# Patient Record
Sex: Female | Born: 1955 | Race: Black or African American | Hispanic: No | Marital: Married | Smoking: Former smoker
Health system: Southern US, Community
[De-identification: ages and names within clinical notes are randomized; demographics above are authoritative.]

## PROBLEM LIST (undated history)

## (undated) DIAGNOSIS — I10 Essential (primary) hypertension: Secondary | ICD-10-CM

## (undated) DIAGNOSIS — K859 Acute pancreatitis without necrosis or infection, unspecified: Secondary | ICD-10-CM

## (undated) HISTORY — PX: TUBAL LIGATION: SHX77

---

## 2002-09-07 ENCOUNTER — Other Ambulatory Visit: Admission: RE | Admit: 2002-09-07 | Discharge: 2002-09-07 | Payer: Self-pay | Admitting: Family Medicine

## 2002-09-13 ENCOUNTER — Encounter: Admission: RE | Admit: 2002-09-13 | Discharge: 2002-09-13 | Payer: Self-pay | Admitting: Family Medicine

## 2002-09-13 ENCOUNTER — Encounter: Payer: Self-pay | Admitting: Family Medicine

## 2008-08-25 ENCOUNTER — Encounter: Admission: RE | Admit: 2008-08-25 | Discharge: 2008-08-25 | Payer: Self-pay | Admitting: Internal Medicine

## 2010-03-10 ENCOUNTER — Encounter: Payer: Self-pay | Admitting: Internal Medicine

## 2011-08-26 ENCOUNTER — Other Ambulatory Visit: Payer: Self-pay | Admitting: Internal Medicine

## 2011-08-26 DIAGNOSIS — Z1231 Encounter for screening mammogram for malignant neoplasm of breast: Secondary | ICD-10-CM

## 2011-08-29 ENCOUNTER — Ambulatory Visit
Admission: RE | Admit: 2011-08-29 | Discharge: 2011-08-29 | Disposition: A | Discharge status: Home / Self Care | Payer: 59 | Source: Ambulatory Visit | Attending: Internal Medicine | Admitting: Internal Medicine

## 2011-08-29 DIAGNOSIS — Z1231 Encounter for screening mammogram for malignant neoplasm of breast: Secondary | ICD-10-CM

## 2013-02-23 ENCOUNTER — Emergency Department (HOSPITAL_COMMUNITY): Payer: 59

## 2013-02-23 ENCOUNTER — Encounter (HOSPITAL_COMMUNITY): Payer: Self-pay | Admitting: Emergency Medicine

## 2013-02-23 ENCOUNTER — Emergency Department (HOSPITAL_COMMUNITY)
Admission: EM | Admit: 2013-02-23 | Discharge: 2013-02-23 | Disposition: A | Discharge status: Home / Self Care | Payer: 59 | Attending: Emergency Medicine | Admitting: Emergency Medicine

## 2013-02-23 DIAGNOSIS — Z880 Allergy status to penicillin: Secondary | ICD-10-CM | POA: Insufficient documentation

## 2013-02-23 DIAGNOSIS — Z87891 Personal history of nicotine dependence: Secondary | ICD-10-CM | POA: Insufficient documentation

## 2013-02-23 DIAGNOSIS — R059 Cough, unspecified: Secondary | ICD-10-CM | POA: Insufficient documentation

## 2013-02-23 DIAGNOSIS — Z79899 Other long term (current) drug therapy: Secondary | ICD-10-CM | POA: Insufficient documentation

## 2013-02-23 DIAGNOSIS — N390 Urinary tract infection, site not specified: Secondary | ICD-10-CM | POA: Insufficient documentation

## 2013-02-23 DIAGNOSIS — I10 Essential (primary) hypertension: Secondary | ICD-10-CM | POA: Insufficient documentation

## 2013-02-23 DIAGNOSIS — R05 Cough: Secondary | ICD-10-CM | POA: Insufficient documentation

## 2013-02-23 DIAGNOSIS — R42 Dizziness and giddiness: Secondary | ICD-10-CM | POA: Insufficient documentation

## 2013-02-23 HISTORY — DX: Essential (primary) hypertension: I10

## 2013-02-23 LAB — URINE MICROSCOPIC-ADD ON

## 2013-02-23 LAB — CBC WITH DIFFERENTIAL/PLATELET
Basophils Absolute: 0 10*3/uL (ref 0.0–0.1)
Basophils Relative: 1 % (ref 0–1)
Eosinophils Absolute: 0.1 10*3/uL (ref 0.0–0.7)
Eosinophils Relative: 2 % (ref 0–5)
HEMATOCRIT: 40.8 % (ref 36.0–46.0)
Hemoglobin: 13.9 g/dL (ref 12.0–15.0)
LYMPHS ABS: 2 10*3/uL (ref 0.7–4.0)
LYMPHS PCT: 51 % — AB (ref 12–46)
MCH: 28.3 pg (ref 26.0–34.0)
MCHC: 34.1 g/dL (ref 30.0–36.0)
MCV: 82.9 fL (ref 78.0–100.0)
MONOS PCT: 11 % (ref 3–12)
Monocytes Absolute: 0.4 10*3/uL (ref 0.1–1.0)
Neutro Abs: 1.4 10*3/uL — ABNORMAL LOW (ref 1.7–7.7)
Neutrophils Relative %: 36 % — ABNORMAL LOW (ref 43–77)
PLATELETS: 141 10*3/uL — AB (ref 150–400)
RBC: 4.92 MIL/uL (ref 3.87–5.11)
RDW: 13.5 % (ref 11.5–15.5)
WBC: 4 10*3/uL (ref 4.0–10.5)

## 2013-02-23 LAB — URINALYSIS, ROUTINE W REFLEX MICROSCOPIC
Glucose, UA: NEGATIVE mg/dL
Hgb urine dipstick: NEGATIVE
KETONES UR: 15 mg/dL — AB
NITRITE: NEGATIVE
Protein, ur: 30 mg/dL — AB
SPECIFIC GRAVITY, URINE: 1.021 (ref 1.005–1.030)
UROBILINOGEN UA: 1 mg/dL (ref 0.0–1.0)
pH: 7 (ref 5.0–8.0)

## 2013-02-23 LAB — COMPREHENSIVE METABOLIC PANEL
ALK PHOS: 145 U/L — AB (ref 39–117)
ALT: 25 U/L (ref 0–35)
AST: 26 U/L (ref 0–37)
Albumin: 3.7 g/dL (ref 3.5–5.2)
BUN: 12 mg/dL (ref 6–23)
CALCIUM: 9.1 mg/dL (ref 8.4–10.5)
CO2: 29 meq/L (ref 19–32)
Chloride: 103 mEq/L (ref 96–112)
Creatinine, Ser: 0.93 mg/dL (ref 0.50–1.10)
GFR calc Af Amer: 76 mL/min — ABNORMAL LOW (ref >90)
GFR, EST NON AFRICAN AMERICAN: 66 mL/min — AB (ref >90)
GLUCOSE: 92 mg/dL (ref 70–99)
Potassium: 3.7 mEq/L (ref 3.7–5.3)
SODIUM: 145 meq/L (ref 137–147)
Total Bilirubin: 0.7 mg/dL (ref 0.3–1.2)
Total Protein: 7.3 g/dL (ref 6.0–8.3)

## 2013-02-23 MED ORDER — SULFAMETHOXAZOLE-TRIMETHOPRIM 800-160 MG PO TABS: 1.0000 | ORAL_TABLET | Freq: Two times a day (BID) | ORAL | Status: DC

## 2013-02-23 MED ORDER — ONDANSETRON HCL 4 MG/2ML IJ SOLN
4.0000 mg | Freq: Once | INTRAMUSCULAR | Status: AC
  Administered 2013-02-23: 4 mg via INTRAVENOUS
  Filled 2013-02-23: qty 2

## 2013-02-23 MED ORDER — MORPHINE SULFATE 4 MG/ML IJ SOLN
4.0000 mg | Freq: Once | INTRAMUSCULAR | Status: AC
  Administered 2013-02-23: 4 mg via INTRAVENOUS
  Filled 2013-02-23: qty 1

## 2013-02-23 NOTE — ED Provider Notes (Signed)
Medical screening examination/treatment/procedure(s) were conducted as a shared visit with non-physician practitioner(s) and myself.  I personally evaluated the patient during the encounter.  EKG Interpretation    Date/Time:  Wednesday February 23 2013 08:11:18 EST Ventricular Rate:  90 PR Interval:  142 QRS Duration: 64 QT Interval:  368 QTC Calculation: 450 R Axis:   38 Text Interpretation:  Normal sinus rhythm Baseline wander No old tracing to compare Confirmed by [facility] SystemWOFFORD  MD, TREY ([id number]) on 02/23/2013 10:30:27 AM            58 yo female with left flank pain and urinary symptoms for about a week.  Flank pain seems to be worse with twisting and turning.  Nontoxic, well appearing, not distressed.  Mild left flank tenderness.  Lungs CTAB, no respiratory distress.  Abd soft and nontender.  UA consistent with UTI.  Plan to treat with abx and have her follow up with PCP.    Clinical Impression: 1. UTI (lower urinary tract infection)       Candyce ChurnJohn David Draydon [place], MD 02/23/13 [phone]

## 2013-02-23 NOTE — ED Notes (Signed)
Report  Given to [place], [place]

## 2013-02-23 NOTE — ED Notes (Signed)
Pt reports left flank pain since 02/17/2013 and hurts with movement and then reports lightheadedness and intermittent headaches.  No NVD or constipation.

## 2013-02-23 NOTE — Discharge Instructions (Signed)
Please follow up with your regular doctor.  If your symptoms persist, who may need to have a CT scan of your lungs.  Flank Pain Flank pain refers to pain that is located on the side of the body between the upper abdomen and the back. The pain may occur over a short period of time (acute) or may be long-term or reoccurring (chronic). It may be mild or severe. Flank pain can be caused by many things. CAUSES  Some of the more common causes of flank pain include:  Muscle strains.   Muscle spasms.   A disease of your spine (vertebral disk disease).   A lung infection (pneumonia).   Fluid around your lungs (pulmonary edema).   A kidney infection.   Kidney stones.   A very painful skin rash caused by the chickenpox virus (shingles).   Gallbladder disease.  HOME CARE INSTRUCTIONS  Home care will depend on the cause of your pain. In general,  Rest as directed by your caregiver.  Drink enough fluids to keep your urine clear or pale yellow.  Only take over-the-counter or prescription medicines as directed by your caregiver. Some medicines may help relieve the pain.  Tell your caregiver about any changes in your pain.  Follow up with your caregiver as directed. SEEK IMMEDIATE MEDICAL CARE IF:   Your pain is not controlled with medicine.   You have new or worsening symptoms.  Your pain increases.   You have abdominal pain.   You have shortness of breath.   You have persistent nausea or vomiting.   You have swelling in your abdomen.   You feel faint or pass out.   You have blood in your urine.  You have a fever or persistent symptoms for more than 2 3 days.  You have a fever and your symptoms suddenly get worse. MAKE SURE YOU:   Understand these instructions.  Will watch your condition.  Will get help right away if you are not doing well or get worse. Document Released: 03/27/2005 Document Revised: 10/29/2011 Document Reviewed: 09/18/2011 [facility] CenterExit[facility]  Patient Information 2014 [place], [place]. Urinary Tract Infection Urinary tract infections (UTIs) can develop anywhere along your urinary tract. Your urinary tract is your body's drainage system for removing wastes and extra water. Your urinary tract includes two kidneys, two ureters, a bladder, and a urethra. Your kidneys are a pair of bean-shaped organs. Each kidney is about the size of your fist. They are located below your ribs, one on each side of your spine. CAUSES Infections are caused by microbes, which are microscopic organisms, including fungi, viruses, and bacteria. These organisms are so small that they can only be seen through a microscope. Bacteria are the microbes that most commonly cause UTIs. SYMPTOMS  Symptoms of UTIs may vary by age and gender of the patient and by the location of the infection. Symptoms in young women typically include a frequent and intense urge to urinate and a painful, burning feeling in the bladder or urethra during urination. Older women and men are more likely to be tired, shaky, and weak and have muscle aches and abdominal pain. A fever may mean the infection is in your kidneys. Other symptoms of a kidney infection include pain in your back or sides below the ribs, nausea, and vomiting. DIAGNOSIS To diagnose a UTI, your caregiver will ask you about your symptoms. Your caregiver also will ask to provide a urine sample. The urine sample will be tested for bacteria and white blood cells.  White blood cells are made by your body to help fight infection. TREATMENT  Typically, UTIs can be treated with medication. Because most UTIs are caused by a bacterial infection, they usually can be treated with the use of antibiotics. The choice of antibiotic and length of treatment depend on your symptoms and the type of bacteria causing your infection. HOME CARE INSTRUCTIONS  If you were prescribed antibiotics, take them exactly as your caregiver instructs you. Finish the  medication even if you feel better after you have only taken some of the medication.  Drink enough water and fluids to keep your urine clear or pale yellow.  Avoid caffeine, tea, and carbonated beverages. They tend to irritate your bladder.  Empty your bladder often. Avoid holding urine for long periods of time.  Empty your bladder before and after sexual intercourse.  After a bowel movement, women should cleanse from front to back. Use each tissue only once. SEEK MEDICAL CARE IF:   You have back pain.  You develop a fever.  Your symptoms do not begin to resolve within 3 days. SEEK IMMEDIATE MEDICAL CARE IF:   You have severe back pain or lower abdominal pain.  You develop chills.  You have nausea or vomiting.  You have continued burning or discomfort with urination. MAKE SURE YOU:   Understand these instructions.  Will watch your condition.  Will get help right away if you are not doing well or get worse. Document Released: 11/13/2004 Document Revised: 08/05/2011 Document Reviewed: 03/14/2011 [facility] Patient Information 2014 [place], [place].

## 2013-02-23 NOTE — ED Provider Notes (Signed)
CSN: [id number]     Arrival date & time 02/23/13  0808 History   First MD Initiated Contact with Patient 02/23/13 0831     Chief Complaint  Patient presents with  . Flank Pain  . Dizziness   (Consider location/radiation/quality/duration/timing/severity/associated sxs/prior Treatment) HPI Comments: Patient presents to the emergency department with chief complaint of left-sided flank pain that began January 1. She also reports cough, as well as some dysuria. She states occasionally she feels a little lightheaded. She has not tried taking anything to alleviate her symptoms. She denies any nausea, vomiting, diarrhea, or constipation. Denies any vaginal discharge. She denies any known mechanism of injury to the back. Pain is sometimes worse with movement.  The history is provided by the patient. No language interpreter was used.    Past Medical History  Diagnosis Date  . Hypertension    History reviewed. No pertinent past surgical history. No family history on file. History  Substance Use Topics  . Smoking status: Former Games developer  . Smokeless tobacco: Not on file  . Alcohol Use: No   OB History   Grav Para Term Preterm Abortions TAB SAB Ect Mult Living                 Review of Systems  All other systems reviewed and are negative.    Allergies  Penicillins  Home Medications   Current Outpatient Rx  Name  Route  Sig  Dispense  Refill  . naproxen sodium (ANAPROX) 220 MG tablet   Oral   Take 440 mg by mouth daily as needed (headache).         . olmesartan-hydrochlorothiazide (BENICAR HCT) 20-12.5 MG per tablet   Oral   Take 1 tablet by mouth daily.          BP 104/71  Pulse 88  Temp(Src) 98 F (36.7 C) (Oral)  Resp 20  Ht 5\' 11"  (1.803 m)  Wt 206 lb (93.441 kg)  BMI 28.74 kg/m2  SpO2 98% Physical Exam  Nursing note and vitals reviewed. Constitutional: She is oriented to person, place, and time. She appears well-developed and well-nourished.  HENT:  Head:  Normocephalic and atraumatic.  Eyes: Conjunctivae and EOM are normal. Pupils are equal, round, and reactive to light.  Neck: Normal range of motion. Neck supple.  Cardiovascular: Normal rate and regular rhythm.  Exam reveals no gallop and no friction rub.   No murmur heard. Pulmonary/Chest: Effort normal and breath sounds normal. No respiratory distress. She has no wheezes. She has no rales. She exhibits no tenderness.  Abdominal: Soft. Bowel sounds are normal. She exhibits no distension and no mass. There is no tenderness. There is no rebound and no guarding.  Bilateral lower abdominal discomfort, as well as some CVA tenderness on the left  Musculoskeletal: Normal range of motion. She exhibits no edema and no tenderness.  Neurological: She is alert and oriented to person, place, and time.  Skin: Skin is warm and dry.  Psychiatric: She has a normal mood and affect. Her behavior is normal. Judgment and thought content normal.    ED Course  Procedures (including critical care time) Results for orders placed during the hospital encounter of 02/23/13  CBC WITH DIFFERENTIAL      Result Value Range   WBC 4.0  4.0 - 10.5 K/uL   RBC 4.92  3.87 - 5.11 MIL/uL   Hemoglobin 13.9  12.0 - 15.0 g/dL   HCT 82.9  56.2 - 13.0 %   MCV  82.9  78.0 - 100.0 fL   MCH 28.3  26.0 - 34.0 pg   MCHC 34.1  30.0 - 36.0 g/dL   RDW 78.213.5  95.611.5 - 21.315.5 %   Platelets 141 (*) 150 - 400 K/uL   Neutrophils Relative % 36 (*) 43 - 77 %   Neutro Abs 1.4 (*) 1.7 - 7.7 K/uL   Lymphocytes Relative 51 (*) 12 - 46 %   Lymphs Abs 2.0  0.7 - 4.0 K/uL   Monocytes Relative 11  3 - 12 %   Monocytes Absolute 0.4  0.1 - 1.0 K/uL   Eosinophils Relative 2  0 - 5 %   Eosinophils Absolute 0.1  0.0 - 0.7 K/uL   Basophils Relative 1  0 - 1 %   Basophils Absolute 0.0  0.0 - 0.1 K/uL  COMPREHENSIVE METABOLIC PANEL      Result Value Range   Sodium 145  137 - 147 mEq/L   Potassium 3.7  3.7 - 5.3 mEq/L   Chloride 103  96 - 112 mEq/L   CO2  29  19 - 32 mEq/L   Glucose, Bld 92  70 - 99 mg/dL   BUN 12  6 - 23 mg/dL   Creatinine, Ser 0.860.93  0.50 - 1.10 mg/dL   Calcium 9.1  8.4 - 57.810.5 mg/dL   Total Protein 7.3  6.0 - 8.3 g/dL   Albumin 3.7  3.5 - 5.2 g/dL   AST 26  0 - 37 U/L   ALT 25  0 - 35 U/L   Alkaline Phosphatase 145 (*) 39 - 117 U/L   Total Bilirubin 0.7  0.3 - 1.2 mg/dL   GFR calc non Af Amer 66 (*) >90 mL/min   GFR calc Af Amer 76 (*) >90 mL/min  URINALYSIS, ROUTINE W REFLEX MICROSCOPIC      Result Value Range   Color, Urine AMBER (*) YELLOW   APPearance CLOUDY (*) CLEAR   Specific Gravity, Urine 1.021  1.005 - 1.030   pH 7.0  5.0 - 8.0   Glucose, UA NEGATIVE  NEGATIVE mg/dL   Hgb urine dipstick NEGATIVE  NEGATIVE   Bilirubin Urine SMALL (*) NEGATIVE   Ketones, ur 15 (*) NEGATIVE mg/dL   Protein, ur 30 (*) NEGATIVE mg/dL   Urobilinogen, UA 1.0  0.0 - 1.0 mg/dL   Nitrite NEGATIVE  NEGATIVE   Leukocytes, UA LARGE (*) NEGATIVE  URINE MICROSCOPIC-ADD ON      Result Value Range   Squamous Epithelial / LPF FEW (*) RARE   WBC, UA 21-50  <3 WBC/hpf   RBC / HPF 0-2  <3 RBC/hpf   Bacteria, UA MANY (*) RARE   Casts HYALINE CASTS (*) NEGATIVE   Urine-Other MUCOUS PRESENT     Dg Chest 2 View  02/23/2013   CLINICAL DATA:  One week history of chest pain and weakness  EXAM: CHEST  2 VIEW  COMPARISON:  None.  FINDINGS: The lungs are mildly hyperinflated with hemidiaphragm flattening. There is no focal infiltrate. The cardiac silhouette is normal in size. The pulmonary vascularity is not engorged. The mediastinum is normal in width. The observed portions of the bony thorax exhibit no acute abnormalities. There is minimal blunting of the right lateral and posterior costophrenic angles.  IMPRESSION: There is no evidence of CHF. There is likely a trace of pleural fluid on the right blunting the costophrenic angles. There is no focal pneumonia. Mild hyperinflation may be voluntary or could reflect underlying COPD.  If the patient's  symptoms persist and remain unexplained, follow-up chest CT scanning may be of value.   Electronically Signed   By: David  [place]   On: 02/23/2013 09:52     EKG Interpretation   None       MDM   1. UTI (lower urinary tract infection)   2. Cough     Patient with cough, dysuria, and bilateral flank pain. Will check urinalysis and chest x-ray. Will get basic labs, give pain medicine and Zofran.  CXR is as above. Will have her follow up with PCP, and get CT if symptoms persist.    Will treat for UTI.  Patient seen by and discussed with Dr. Loretha Stapler, who agrees with the plan.   Roxy Horseman, PA-C 02/23/13 1117

## 2013-02-24 LAB — URINE CULTURE
CULTURE: NO GROWTH
Colony Count: NO GROWTH

## 2013-02-25 NOTE — ED Provider Notes (Signed)
Medical screening examination/treatment/procedure(s) were conducted as a shared visit with non-physician practitioner(s) and myself.  I personally evaluated the patient during the encounter.  EKG Interpretation    Date/Time:  Wednesday February 23 2013 08:11:18 EST Ventricular Rate:  90 PR Interval:  142 QRS Duration: 64 QT Interval:  368 QTC Calculation: 450 R Axis:   38 Text Interpretation:  Normal sinus rhythm Baseline wander No old tracing to compare Confirmed by [facility]  [facility], TREY ([id number]) on 02/23/2013 10:30:27 AM              Hannah ChurnJohn David Takerra Lupinacci, MD 02/25/13 [phone]

## 2013-09-07 ENCOUNTER — Other Ambulatory Visit: Payer: Self-pay

## 2013-09-07 DIAGNOSIS — Z1231 Encounter for screening mammogram for malignant neoplasm of breast: Secondary | ICD-10-CM

## 2013-09-19 ENCOUNTER — Ambulatory Visit: Payer: 59

## 2013-09-30 ENCOUNTER — Encounter: Payer: Self-pay | Admitting: Podiatry

## 2013-09-30 ENCOUNTER — Ambulatory Visit (INDEPENDENT_AMBULATORY_CARE_PROVIDER_SITE_OTHER): Payer: 59

## 2013-09-30 ENCOUNTER — Ambulatory Visit (INDEPENDENT_AMBULATORY_CARE_PROVIDER_SITE_OTHER): Payer: 59 | Admitting: Podiatry

## 2013-09-30 DIAGNOSIS — M722 Plantar fascial fibromatosis: Secondary | ICD-10-CM

## 2013-09-30 NOTE — Patient Instructions (Signed)
Plantar Fasciitis (Heel Spur Syndrome) with Rehab The plantar fascia is a fibrous, ligament-like, soft-tissue structure that spans the bottom of the foot. Plantar fasciitis is a condition that causes pain in the foot due to inflammation of the tissue. SYMPTOMS   Pain and tenderness on the underneath side of the foot.  Pain that worsens with standing or walking. CAUSES  Plantar fasciitis is caused by irritation and injury to the plantar fascia on the underneath side of the foot. Common mechanisms of injury include:  Direct trauma to bottom of the foot.  Damage to a small nerve that runs under the foot where the main fascia attaches to the heel bone.  Stress placed on the plantar fascia due to bone spurs. RISK INCREASES WITH:   Activities that place stress on the plantar fascia (running, jumping, pivoting, or cutting).  Poor strength and flexibility.  Improperly fitted shoes.  Tight calf muscles.  Flat feet.  Failure to warm-up properly before activity.  Obesity. PREVENTION  Warm up and stretch properly before activity.  Allow for adequate recovery between workouts.  Maintain physical fitness:  Strength, flexibility, and endurance.  Cardiovascular fitness.  Maintain a health body weight.  Avoid stress on the plantar fascia.  Wear properly fitted shoes, including arch supports for individuals who have flat feet. PROGNOSIS  If treated properly, then the symptoms of plantar fasciitis usually resolve without surgery. However, occasionally surgery is necessary. RELATED COMPLICATIONS   Recurrent symptoms that may result in a chronic condition.  Problems of the lower back that are caused by compensating for the injury, such as limping.  Pain or weakness of the foot during push-off following surgery.  Chronic inflammation, scarring, and partial or complete fascia tear, occurring more often from repeated injections. TREATMENT  Treatment initially involves the use of  ice and medication to help reduce pain and inflammation. The use of strengthening and stretching exercises may help reduce pain with activity, especially stretches of the Achilles tendon. These exercises may be performed at home or with a therapist. Your caregiver may recommend that you use heel cups of arch supports to help reduce stress on the plantar fascia. Occasionally, corticosteroid injections are given to reduce inflammation. If symptoms persist for greater than 6 months despite non-surgical (conservative), then surgery may be recommended.  MEDICATION   If pain medication is necessary, then nonsteroidal anti-inflammatory medications, such as aspirin and ibuprofen, or other minor pain relievers, such as acetaminophen, are often recommended.  Do not take pain medication within 7 days before surgery.  Prescription pain relievers may be given if deemed necessary by your caregiver. Use only as directed and only as much as you need.  Corticosteroid injections may be given by your caregiver. These injections should be reserved for the most serious cases, because they may only be given a certain number of times. HEAT AND COLD  Cold treatment (icing) relieves pain and reduces inflammation. Cold treatment should be applied for 10 to 15 minutes every 2 to 3 hours for inflammation and pain and immediately after any activity that aggravates your symptoms. Use ice packs or massage the area with a piece of ice (ice massage).  Heat treatment may be used prior to performing the stretching and strengthening activities prescribed by your caregiver, physical therapist, or athletic trainer. Use a heat pack or soak the injury in warm water. SEEK IMMEDIATE MEDICAL CARE IF:  Treatment seems to offer no benefit, or the condition worsens.  Any medications produce adverse side effects. EXERCISES RANGE   OF MOTION (ROM) AND STRETCHING EXERCISES - Plantar Fasciitis (Heel Spur Syndrome) These exercises may help you  when beginning to rehabilitate your injury. Your symptoms may resolve with or without further involvement from your physician, physical therapist or athletic trainer. While completing these exercises, remember:   Restoring tissue flexibility helps normal motion to return to the joints. This allows healthier, less painful movement and activity.  An effective stretch should be held for at least 30 seconds.  A stretch should never be painful. You should only feel a gentle lengthening or release in the stretched tissue. RANGE OF MOTION - Toe Extension, Flexion  Sit with your right / left leg crossed over your opposite knee.  Grasp your toes and gently pull them back toward the top of your foot. You should feel a stretch on the bottom of your toes and/or foot.  Hold this stretch for __________ seconds.  Now, gently pull your toes toward the bottom of your foot. You should feel a stretch on the top of your toes and or foot.  Hold this stretch for __________ seconds. Repeat __________ times. Complete this stretch __________ times per day.  RANGE OF MOTION - Ankle Dorsiflexion, Active Assisted  Remove shoes and sit on a chair that is preferably not on a carpeted surface.  Place right / left foot under knee. Extend your opposite leg for support.  Keeping your heel down, slide your right / left foot back toward the chair until you feel a stretch at your ankle or calf. If you do not feel a stretch, slide your bottom forward to the edge of the chair, while still keeping your heel down.  Hold this stretch for __________ seconds. Repeat __________ times. Complete this stretch __________ times per day.  STRETCH - Gastroc, Standing  Place hands on wall.  Extend right / left leg, keeping the front knee somewhat bent.  Slightly point your toes inward on your back foot.  Keeping your right / left heel on the floor and your knee straight, shift your weight toward the wall, not allowing your back to  arch.  You should feel a gentle stretch in the right / left calf. Hold this position for __________ seconds. Repeat __________ times. Complete this stretch __________ times per day. STRETCH - Soleus, Standing  Place hands on wall.  Extend right / left leg, keeping the other knee somewhat bent.  Slightly point your toes inward on your back foot.  Keep your right / left heel on the floor, bend your back knee, and slightly shift your weight over the back leg so that you feel a gentle stretch deep in your back calf.  Hold this position for __________ seconds. Repeat __________ times. Complete this stretch __________ times per day. STRETCH - Gastrocsoleus, Standing  Note: This exercise can place a lot of stress on your foot and ankle. Please complete this exercise only if specifically instructed by your caregiver.   Place the ball of your right / left foot on a step, keeping your other foot firmly on the same step.  Hold on to the wall or a rail for balance.  Slowly lift your other foot, allowing your body weight to press your heel down over the edge of the step.  You should feel a stretch in your right / left calf.  Hold this position for __________ seconds.  Repeat this exercise with a slight bend in your right / left knee. Repeat __________ times. Complete this stretch __________ times per day.    STRENGTHENING EXERCISES - Plantar Fasciitis (Heel Spur Syndrome)  These exercises may help you when beginning to rehabilitate your injury. They may resolve your symptoms with or without further involvement from your physician, physical therapist or athletic trainer. While completing these exercises, remember:   Muscles can gain both the endurance and the strength needed for everyday activities through controlled exercises.  Complete these exercises as instructed by your physician, physical therapist or athletic trainer. Progress the resistance and repetitions only as guided. STRENGTH -  Towel Curls  Sit in a chair positioned on a non-carpeted surface.  Place your foot on a towel, keeping your heel on the floor.  Pull the towel toward your heel by only curling your toes. Keep your heel on the floor.  If instructed by your physician, physical therapist or athletic trainer, add ____________________ at the end of the towel. Repeat __________ times. Complete this exercise __________ times per day. STRENGTH - Ankle Inversion  Secure one end of a rubber exercise band/tubing to a fixed object (table, pole). Loop the other end around your foot just before your toes.  Place your fists between your knees. This will focus your strengthening at your ankle.  Slowly, pull your big toe up and in, making sure the band/tubing is positioned to resist the entire motion.  Hold this position for __________ seconds.  Have your muscles resist the band/tubing as it slowly pulls your foot back to the starting position. Repeat __________ times. Complete this exercises __________ times per day.  Document Released: 02/03/2005 Document Revised: 04/28/2011 Document Reviewed: 05/18/2008 [employer] Patient Information 2015 [employer]. This information is not intended to replace advice given to you by your health care provider. Make sure you discuss any questions you have with your health care provider.  

## 2013-09-30 NOTE — Progress Notes (Signed)
   Subjective:    Patient ID: Hannah Burgess, female    DOB: 27-Dec-1955, 58 y.o.   MRN: [id number]  HPI  Hannah Burgess presents the office today with complaints of right heel pain. She states that the pain has been present for approximately 6 months. She states the pain is worse in the morning and she gets some relief with ambulation to she states that she saw her primary care physician where she prescribed a Medrol Dosepak which she got some relief however the pain persists. He states that she also has osteoarthritis of her right knee and she gets periodic numbness extending from her R knee to her foot. This is not an acute change his been ongoing for period of time. She denies any recent trauma to the area no other complaints at this time.    Review of Systems  Cardiovascular:       Swelling   Musculoskeletal:       Joint pain   Neurological: Positive for numbness.  All other systems reviewed and are negative.      Objective:   Physical Exam AAO x3, NAD DP/PT pulses palpable 2/4 b/l. CRT < 3sec. Protective sensation intact with a Semmes Weinstein monofilament, vibratory sensation intact.  MMT 5/5 b/l, ROM WNL Tenderness to palpation of the plantar medial aspect of the patient's right heel at the insertion of the plantar fascia. The fascia intact. Mild equinus b/l.  Mild decrease in the medial arch height upon weightbearing.      Assessment & Plan:  58 year old female with a right foot plantar fasciitis. -X-rays were obtained which did not reveal any acute fracture the x-ray report for full detail -At this time surgical versus conservative treatment was discussed with the patient in great detail including all alternatives, risks, complications. Etiology discussed -At this time recommended a steroid injection to help decrease the inflammation from which the patient agrees. Under sterile skin preparation a total of 2 cc of a mixture of Kenalog 10, 0.5% Marcaine plain and 2% lidocaine  plain was infiltrated into the medial aspect of the patient's heel around the area of the plantar fascia. Patient tolerated the procedure well without complications. -Discussed proper shoe gear with support. -Dispensed plantar fascial brace -Discussed stretching exercises -Ice -Patient is oriented taking Naprosyn, previously tried Medrol Dosepak -Followup in one month or sooner any problems are to arise. -Recommended following up with her primary care physician or orthopedics for her right knee pain and numbness.

## 2013-10-01 MED ORDER — TRIAMCINOLONE ACETONIDE 10 MG/ML IJ SUSP: 10.0000 mg | Freq: Once | INTRAMUSCULAR | Status: AC

## 2013-10-03 ENCOUNTER — Ambulatory Visit: Payer: 59

## 2013-10-13 ENCOUNTER — Ambulatory Visit
Admission: RE | Admit: 2013-10-13 | Discharge: 2013-10-13 | Disposition: A | Discharge status: Home / Self Care | Payer: 59 | Source: Ambulatory Visit

## 2013-10-13 DIAGNOSIS — Z1231 Encounter for screening mammogram for malignant neoplasm of breast: Secondary | ICD-10-CM

## 2013-10-28 ENCOUNTER — Encounter: Payer: Self-pay | Admitting: Podiatry

## 2013-10-28 ENCOUNTER — Ambulatory Visit (INDEPENDENT_AMBULATORY_CARE_PROVIDER_SITE_OTHER): Payer: 59 | Admitting: Podiatry

## 2013-10-28 VITALS — BP 133/87 | HR 86 | Resp 16

## 2013-10-28 DIAGNOSIS — M722 Plantar fascial fibromatosis: Secondary | ICD-10-CM

## 2013-10-28 NOTE — Patient Instructions (Signed)
Plantar Fasciitis (Heel Spur Syndrome) with Rehab The plantar fascia is a fibrous, ligament-like, soft-tissue structure that spans the bottom of the foot. Plantar fasciitis is a condition that causes pain in the foot due to inflammation of the tissue. SYMPTOMS   Pain and tenderness on the underneath side of the foot.  Pain that worsens with standing or walking. CAUSES  Plantar fasciitis is caused by irritation and injury to the plantar fascia on the underneath side of the foot. Common mechanisms of injury include:  Direct trauma to bottom of the foot.  Damage to a small nerve that runs under the foot where the main fascia attaches to the heel bone.  Stress placed on the plantar fascia due to bone spurs. RISK INCREASES WITH:   Activities that place stress on the plantar fascia (running, jumping, pivoting, or cutting).  Poor strength and flexibility.  Improperly fitted shoes.  Tight calf muscles.  Flat feet.  Failure to warm-up properly before activity.  Obesity. PREVENTION  Warm up and stretch properly before activity.  Allow for adequate recovery between workouts.  Maintain physical fitness:  Strength, flexibility, and endurance.  Cardiovascular fitness.  Maintain a health body weight.  Avoid stress on the plantar fascia.  Wear properly fitted shoes, including arch supports for individuals who have flat feet. PROGNOSIS  If treated properly, then the symptoms of plantar fasciitis usually resolve without surgery. However, occasionally surgery is necessary. RELATED COMPLICATIONS   Recurrent symptoms that may result in a chronic condition.  Problems of the lower back that are caused by compensating for the injury, such as limping.  Pain or weakness of the foot during push-off following surgery.  Chronic inflammation, scarring, and partial or complete fascia tear, occurring more often from repeated injections. TREATMENT  Treatment initially involves the use of  ice and medication to help reduce pain and inflammation. The use of strengthening and stretching exercises may help reduce pain with activity, especially stretches of the Achilles tendon. These exercises may be performed at home or with a therapist. Your caregiver may recommend that you use heel cups of arch supports to help reduce stress on the plantar fascia. Occasionally, corticosteroid injections are given to reduce inflammation. If symptoms persist for greater than 6 months despite non-surgical (conservative), then surgery may be recommended.  MEDICATION   If pain medication is necessary, then nonsteroidal anti-inflammatory medications, such as aspirin and ibuprofen, or other minor pain relievers, such as acetaminophen, are often recommended.  Do not take pain medication within 7 days before surgery.  Prescription pain relievers may be given if deemed necessary by your caregiver. Use only as directed and only as much as you need.  Corticosteroid injections may be given by your caregiver. These injections should be reserved for the most serious cases, because they may only be given a certain number of times. HEAT AND COLD  Cold treatment (icing) relieves pain and reduces inflammation. Cold treatment should be applied for 10 to 15 minutes every 2 to 3 hours for inflammation and pain and immediately after any activity that aggravates your symptoms. Use ice packs or massage the area with a piece of ice (ice massage).  Heat treatment may be used prior to performing the stretching and strengthening activities prescribed by your caregiver, physical therapist, or athletic trainer. Use a heat pack or soak the injury in warm water. SEEK IMMEDIATE MEDICAL CARE IF:  Treatment seems to offer no benefit, or the condition worsens.  Any medications produce adverse side effects. EXERCISES RANGE   OF MOTION (ROM) AND STRETCHING EXERCISES - Plantar Fasciitis (Heel Spur Syndrome) These exercises may help you  when beginning to rehabilitate your injury. Your symptoms may resolve with or without further involvement from your physician, physical therapist or athletic trainer. While completing these exercises, remember:   Restoring tissue flexibility helps normal motion to return to the joints. This allows healthier, less painful movement and activity.  An effective stretch should be held for at least 30 seconds.  A stretch should never be painful. You should only feel a gentle lengthening or release in the stretched tissue. RANGE OF MOTION - Toe Extension, Flexion  Sit with your right / left leg crossed over your opposite knee.  Grasp your toes and gently pull them back toward the top of your foot. You should feel a stretch on the bottom of your toes and/or foot.  Hold this stretch for __________ seconds.  Now, gently pull your toes toward the bottom of your foot. You should feel a stretch on the top of your toes and or foot.  Hold this stretch for __________ seconds. Repeat __________ times. Complete this stretch __________ times per day.  RANGE OF MOTION - Ankle Dorsiflexion, Active Assisted  Remove shoes and sit on a chair that is preferably not on a carpeted surface.  Place right / left foot under knee. Extend your opposite leg for support.  Keeping your heel down, slide your right / left foot back toward the chair until you feel a stretch at your ankle or calf. If you do not feel a stretch, slide your bottom forward to the edge of the chair, while still keeping your heel down.  Hold this stretch for __________ seconds. Repeat __________ times. Complete this stretch __________ times per day.  STRETCH - Gastroc, Standing  Place hands on wall.  Extend right / left leg, keeping the front knee somewhat bent.  Slightly point your toes inward on your back foot.  Keeping your right / left heel on the floor and your knee straight, shift your weight toward the wall, not allowing your back to  arch.  You should feel a gentle stretch in the right / left calf. Hold this position for __________ seconds. Repeat __________ times. Complete this stretch __________ times per day. STRETCH - Soleus, Standing  Place hands on wall.  Extend right / left leg, keeping the other knee somewhat bent.  Slightly point your toes inward on your back foot.  Keep your right / left heel on the floor, bend your back knee, and slightly shift your weight over the back leg so that you feel a gentle stretch deep in your back calf.  Hold this position for __________ seconds. Repeat __________ times. Complete this stretch __________ times per day. STRETCH - Gastrocsoleus, Standing  Note: This exercise can place a lot of stress on your foot and ankle. Please complete this exercise only if specifically instructed by your caregiver.   Place the ball of your right / left foot on a step, keeping your other foot firmly on the same step.  Hold on to the wall or a rail for balance.  Slowly lift your other foot, allowing your body weight to press your heel down over the edge of the step.  You should feel a stretch in your right / left calf.  Hold this position for __________ seconds.  Repeat this exercise with a slight bend in your right / left knee. Repeat __________ times. Complete this stretch __________ times per day.    STRENGTHENING EXERCISES - Plantar Fasciitis (Heel Spur Syndrome)  These exercises may help you when beginning to rehabilitate your injury. They may resolve your symptoms with or without further involvement from your physician, physical therapist or athletic trainer. While completing these exercises, remember:   Muscles can gain both the endurance and the strength needed for everyday activities through controlled exercises.  Complete these exercises as instructed by your physician, physical therapist or athletic trainer. Progress the resistance and repetitions only as guided. STRENGTH -  Towel Curls  Sit in a chair positioned on a non-carpeted surface.  Place your foot on a towel, keeping your heel on the floor.  Pull the towel toward your heel by only curling your toes. Keep your heel on the floor.  If instructed by your physician, physical therapist or athletic trainer, add ____________________ at the end of the towel. Repeat __________ times. Complete this exercise __________ times per day. STRENGTH - Ankle Inversion  Secure one end of a rubber exercise band/tubing to a fixed object (table, pole). Loop the other end around your foot just before your toes.  Place your fists between your knees. This will focus your strengthening at your ankle.  Slowly, pull your big toe up and in, making sure the band/tubing is positioned to resist the entire motion.  Hold this position for __________ seconds.  Have your muscles resist the band/tubing as it slowly pulls your foot back to the starting position. Repeat __________ times. Complete this exercises __________ times per day.  Document Released: 02/03/2005 Document Revised: 04/28/2011 Document Reviewed: 05/18/2008 [employer] Patient Information 2015 [employer]. This information is not intended to replace advice given to you by your health care provider. Make sure you discuss any questions you have with your health care provider.  

## 2013-10-30 NOTE — Progress Notes (Signed)
Patient ID: Hannah Burgess, female   DOB: Aug 13, 1955, 58 y.o.   MRN: [id number]  Subjective: Ms. Large returns the office they for followup evaluation of right heel pain. She states that the injection she had prior gave her some relief of symptoms. She does continue to have some discomfort in her right heel. She states she has continued with icing of the area. She has not been stretching. Wears plantar fascial brace intermittently. No new complaints at this time. No other complaints.  Objective: AAO x3, NAD DP/PT pulses palpable 2/4 b/l. CRT < 3sec Protective sensation intact with Dorann Ou monofilament, Achilles tendon reflex intact, vibratory sensation intact. Mild tenderness on palpation to the plantar medial tubercle of the right calcaneus at the insertion of the plantar fascia, although decrease from prior. No pain with lateral compression of the calcaneus or along the posterior aspect. Mild equinus. Decrease in medial arch height upon weightbearing. Plantar fascia appears intact. MMT 5/5, ROM WNL No open lesion No leg pain/warmth/swelling  Assessment: 58 year old female right foot plantar fasciitis  Plan: -Alternatives, risks, complications discussed for various treatments in detail. -At this time patient wishes to hold off on another steroid injection. -Continue with icing and stretching daily. -She did purchase new shoes in the meantime however upon review tissues have no support and are very flexible. Discussed with her shoe buying tips in order to purchase a more supportive shoe. -Continue of plantar fascial brace. -Followup in one month or sooner if any palms are to arise or any changes symptoms.

## 2013-11-18 ENCOUNTER — Ambulatory Visit: Payer: 59 | Admitting: Podiatry

## 2013-11-30 ENCOUNTER — Ambulatory Visit: Payer: 59 | Admitting: Podiatry

## 2015-09-23 ENCOUNTER — Encounter (HOSPITAL_COMMUNITY): Payer: Self-pay | Admitting: *Deleted

## 2015-09-23 ENCOUNTER — Emergency Department (HOSPITAL_COMMUNITY): Payer: 59

## 2015-09-23 ENCOUNTER — Emergency Department (HOSPITAL_COMMUNITY)
Admission: EM | Admit: 2015-09-23 | Discharge: 2015-09-23 | Disposition: A | Discharge status: Home / Self Care | Payer: 59 | Attending: Emergency Medicine | Admitting: Emergency Medicine

## 2015-09-23 DIAGNOSIS — Z79899 Other long term (current) drug therapy: Secondary | ICD-10-CM | POA: Diagnosis not present

## 2015-09-23 DIAGNOSIS — R1031 Right lower quadrant pain: Secondary | ICD-10-CM | POA: Insufficient documentation

## 2015-09-23 DIAGNOSIS — N390 Urinary tract infection, site not specified: Secondary | ICD-10-CM | POA: Insufficient documentation

## 2015-09-23 DIAGNOSIS — Z87891 Personal history of nicotine dependence: Secondary | ICD-10-CM | POA: Insufficient documentation

## 2015-09-23 DIAGNOSIS — I10 Essential (primary) hypertension: Secondary | ICD-10-CM | POA: Insufficient documentation

## 2015-09-23 LAB — COMPREHENSIVE METABOLIC PANEL
ALT: 17 U/L (ref 14–54)
AST: 23 U/L (ref 15–41)
Albumin: 3.8 g/dL (ref 3.5–5.0)
Alkaline Phosphatase: 140 U/L — ABNORMAL HIGH (ref 38–126)
Anion gap: 8 (ref 5–15)
BILIRUBIN TOTAL: 0.6 mg/dL (ref 0.3–1.2)
BUN: 9 mg/dL (ref 6–20)
CALCIUM: 8.9 mg/dL (ref 8.9–10.3)
CO2: 26 mmol/L (ref 22–32)
CREATININE: 1.08 mg/dL — AB (ref 0.44–1.00)
Chloride: 102 mmol/L (ref 101–111)
GFR, EST NON AFRICAN AMERICAN: 54 mL/min — AB (ref >60)
Glucose, Bld: 103 mg/dL — ABNORMAL HIGH (ref 65–99)
Potassium: 3.4 mmol/L — ABNORMAL LOW (ref 3.5–5.1)
Sodium: 136 mmol/L (ref 135–145)
TOTAL PROTEIN: 7.2 g/dL (ref 6.5–8.1)

## 2015-09-23 LAB — URINALYSIS, ROUTINE W REFLEX MICROSCOPIC
Bilirubin Urine: NEGATIVE
Glucose, UA: NEGATIVE mg/dL
Ketones, ur: NEGATIVE mg/dL
NITRITE: NEGATIVE
PROTEIN: NEGATIVE mg/dL
Specific Gravity, Urine: 1.019 (ref 1.005–1.030)
pH: 8.5 — ABNORMAL HIGH (ref 5.0–8.0)

## 2015-09-23 LAB — CBC
HCT: 42 % (ref 36.0–46.0)
Hemoglobin: 13.4 g/dL (ref 12.0–15.0)
MCH: 26.9 pg (ref 26.0–34.0)
MCHC: 31.9 g/dL (ref 30.0–36.0)
MCV: 84.2 fL (ref 78.0–100.0)
PLATELETS: 176 10*3/uL (ref 150–400)
RBC: 4.99 MIL/uL (ref 3.87–5.11)
RDW: 13.1 % (ref 11.5–15.5)
WBC: 7.7 10*3/uL (ref 4.0–10.5)

## 2015-09-23 LAB — LIPASE, BLOOD: LIPASE: 18 U/L (ref 11–51)

## 2015-09-23 LAB — URINE MICROSCOPIC-ADD ON: BACTERIA UA: NONE SEEN

## 2015-09-23 MED ORDER — IOPAMIDOL (ISOVUE-300) INJECTION 61%
INTRAVENOUS | Status: AC
  Administered 2015-09-23: 100 mL
  Filled 2015-09-23: qty 100

## 2015-09-23 MED ORDER — CIPROFLOXACIN HCL 500 MG PO TABS: 500.0000 mg | ORAL_TABLET | Freq: Two times a day (BID) | ORAL | 0 refills | Status: DC

## 2015-09-23 MED ORDER — SODIUM CHLORIDE 0.9 % IV BOLUS (SEPSIS)
1000.0000 mL | Freq: Once | INTRAVENOUS | Status: AC
  Administered 2015-09-23: 1000 mL via INTRAVENOUS

## 2015-09-23 NOTE — Discharge Instructions (Signed)
Cipro as prescribed.  Tylenol or ibuprofen as needed for pain.  Return to the emergency department if your symptoms significantly worsen or change.

## 2015-09-23 NOTE — ED Provider Notes (Signed)
[facility]-EMERGENCY DEPT Provider Note   CSN: [id number] Arrival date & time: 09/23/15  1351  First Provider Contact:  None       History   Chief Complaint Chief Complaint  Patient presents with  . Abdominal Pain  . Emesis    HPI Hannah Burgess is a 60 y.o. female.  Patient is a 60 year old female with no significant past medical history. She presents for evaluation of right-sided abdominal pain that started early this a.m. while she was sleeping. She went to bed feeling well and woke her from sleep. She denies any fevers or chills. She does feel nauseated and has vomited. She denies any diarrhea or constipation. She denies any urinary complaints.  Her only prior abdominal surgery is a tubal ligation many years ago. She also reports having a colonoscopy last year which was unremarkable with the exception of removal of several small polyps.   The history is provided by the patient.  Abdominal Pain   This is a new problem. Episode onset: This morning. The problem occurs constantly. The problem has been gradually worsening. The pain is associated with an unknown factor. The pain is located in the RLQ. The pain is moderate. Nothing aggravates the symptoms. Nothing relieves the symptoms.    Past Medical History:  Diagnosis Date  . Hypertension     There are no active problems to display for this patient.   History reviewed. No pertinent surgical history.  OB History    No data available       Home Medications    Prior to Admission medications   Medication Sig Start Date End Date Taking? Authorizing Provider  amLODipine (NORVASC) 5 MG tablet  08/06/13   Historical Provider, MD  hydrochlorothiazide (MICROZIDE) 12.5 MG capsule  09/07/13   Historical Provider, MD  naproxen sodium (ANAPROX) 220 MG tablet Take 440 mg by mouth daily as needed (headache).    Historical Provider, MD  olmesartan-hydrochlorothiazide (BENICAR HCT) 20-12.5 MG per tablet Take 1 tablet by mouth daily.     Historical Provider, MD  sulfamethoxazole-trimethoprim (SEPTRA DS) 800-160 MG per tablet Take 1 tablet by mouth every 12 (twelve) hours. 02/23/13   Roxy Horseman, PA-C  Vitamin D, Ergocalciferol, (DRISDOL) 50000 UNITS CAPS capsule  09/09/13   Historical Provider, MD    Family History History reviewed. No pertinent family history.  Social History Social History  Substance Use Topics  . Smoking status: Former Games developer  . Smokeless tobacco: Not on file  . Alcohol use No     Allergies   Penicillins   Review of Systems Review of Systems  All other systems reviewed and are negative.    Physical Exam Updated Vital Signs BP 158/83 (BP Location: Left Arm)   Pulse 71   Temp 98.4 F (36.9 C) (Oral)   Resp 16   SpO2 99%   Physical Exam  Constitutional: She is oriented to person, place, and time. She appears well-developed and well-nourished. No distress.  HENT:  Head: Normocephalic and atraumatic.  Neck: Normal range of motion. Neck supple.  Cardiovascular: Normal rate and regular rhythm.  Exam reveals no gallop and no friction rub.   No murmur heard. Pulmonary/Chest: Effort normal and breath sounds normal. No respiratory distress. She has no wheezes.  Abdominal: Soft. Bowel sounds are normal. She exhibits no distension. There is tenderness. There is no guarding.  There is tenderness to palpation in the right lower quadrant.  Musculoskeletal: Normal range of motion.  Neurological: She is alert and oriented to  person, place, and time.  Skin: Skin is warm and dry. She is not diaphoretic.  Nursing note and vitals reviewed.    ED Treatments / Results  Labs (all labs ordered are listed, but only abnormal results are displayed) Labs Reviewed  COMPREHENSIVE METABOLIC PANEL - Abnormal; Notable for the following:       Result Value   Potassium 3.4 (*)    Glucose, Bld 103 (*)    Creatinine, Ser 1.08 (*)    Alkaline Phosphatase 140 (*)    GFR calc non Af Amer 54 (*)    All other  components within normal limits  LIPASE, BLOOD  CBC  URINALYSIS, ROUTINE W REFLEX MICROSCOPIC (NOT [facility])    EKG  EKG Interpretation None       Radiology No results found.  Procedures Procedures (including critical care time)  Medications Ordered in ED Medications  sodium chloride 0.9 % bolus 1,000 mL (not administered)     Initial Impression / Assessment and Plan / ED Course  I have reviewed the triage vital signs and the nursing notes.  Pertinent labs & imaging results that were available during my care of the patient were reviewed by me and considered in my medical decision making (see chart for details).  Clinical Course      Final Clinical Impressions(s) / ED Diagnoses   Final diagnoses:  None   Patient presents with right-sided abdominal pain that woke her from sleep early this morning. Her exam was concerning for possible appendicitis. She has no white count and CT scan is negative. There is no alternate pathology on the CT scan to account for her symptoms and she appears to be feeling better. She will be treated for what appears to be a mild UTI with Keflex and advised to return as needed if symptoms worsen or change.  New Prescriptions New Prescriptions   No medications on file     Geoffery Lyonsouglas Davontae Prusinski, MD 09/23/15 [phone]

## 2015-09-23 NOTE — ED Triage Notes (Signed)
Pt reports onset this am of right side pain and n/v, chills, feeling lightheaded. Denies diarrhea.

## 2016-05-15 ENCOUNTER — Ambulatory Visit (HOSPITAL_COMMUNITY)
Admission: EM | Admit: 2016-05-15 | Discharge: 2016-05-15 | Disposition: A | Discharge status: Home / Self Care | Payer: 59 | Attending: Family Medicine | Admitting: Family Medicine

## 2016-05-15 ENCOUNTER — Encounter (HOSPITAL_COMMUNITY): Payer: Self-pay | Admitting: Family Medicine

## 2016-05-15 DIAGNOSIS — J069 Acute upper respiratory infection, unspecified: Secondary | ICD-10-CM

## 2016-05-15 DIAGNOSIS — B9789 Other viral agents as the cause of diseases classified elsewhere: Secondary | ICD-10-CM

## 2016-05-15 MED ORDER — BENZONATATE 100 MG PO CAPS: 100.0000 mg | ORAL_CAPSULE | Freq: Three times a day (TID) | ORAL | 0 refills | Status: DC

## 2016-05-15 NOTE — ED Triage Notes (Signed)
Pt here for cough, congestion since Friday . sts chest hurts when she coughs.

## 2016-05-15 NOTE — ED Provider Notes (Signed)
CSN: [id number]     Arrival date & time 05/15/16  1248 History   First MD Initiated Contact with Patient 05/15/16 1317     Chief Complaint  Patient presents with  . Cough  . Weakness   (Consider location/radiation/quality/duration/timing/severity/associated sxs/prior Treatment) 61 year old female presents with a six-day history of URI symptoms   The history is provided by the patient.  Cough  Cough characteristics:  Non-productive, dry and hacking Sputum characteristics:  Clear Severity:  Moderate Onset quality:  Gradual Duration:  6 days Timing:  Constant Progression:  Worsening Chronicity:  New Smoker: no   Context: not animal exposure, not exposure to allergens, not fumes, not occupational exposure, not sick contacts and not upper respiratory infection   Relieved by:  None tried Worsened by:  Nothing Ineffective treatments:  None tried Associated symptoms: chills, rhinorrhea, sinus congestion and sore throat   Associated symptoms: no chest pain, no ear fullness, no ear pain, no eye discharge, no fever, no headaches, no myalgias, no rash, no shortness of breath and no wheezing   Weakness  Pertinent negatives include no shortness of breath, no chest pain, no vomiting and no headaches.    Past Medical History:  Diagnosis Date  . Hypertension    History reviewed. No pertinent surgical history. History reviewed. No pertinent family history. Social History  Substance Use Topics  . Smoking status: Former Games developermoker  . Smokeless tobacco: Never Used  . Alcohol use No   OB History    No data available     Review of Systems  Constitutional: Positive for chills. Negative for fever.  HENT: Positive for rhinorrhea and sore throat. Negative for ear pain.   Eyes: Negative for discharge.  Respiratory: Positive for cough. Negative for shortness of breath and wheezing.   Cardiovascular: Negative for chest pain and palpitations.  Gastrointestinal: Negative for abdominal pain,  constipation, nausea and vomiting.  Genitourinary: Negative.   Musculoskeletal: Negative for myalgias, neck pain and neck stiffness.  Skin: Negative for rash.  Neurological: Positive for weakness. Negative for headaches.  All other systems reviewed and are negative.   Allergies  Penicillins  Home Medications   Prior to Admission medications   Medication Sig Start Date End Date Taking? Authorizing Provider  amLODipine (NORVASC) 5 MG tablet Take 5 mg by mouth daily.  08/06/13   Historical Provider, MD  benzonatate (TESSALON) 100 MG capsule Take 1 capsule (100 mg total) by mouth every 8 (eight) hours. 05/15/16   Dorena BodoLawrence Lakasha Mcfall, NP   Meds Ordered and Administered this Visit  Medications - No data to display  BP (!) 159/88 (BP Location: Left Arm)   Pulse 60   Temp 99.3 F (37.4 C) (Oral)   Resp 14   SpO2 98%  No data found.   Physical Exam  Constitutional: She is oriented to person, place, and time. She appears well-developed and well-nourished. She does not have a sickly appearance. She does not appear ill. No distress.  HENT:  Head: Normocephalic and atraumatic.  Right Ear: Tympanic membrane and external ear normal.  Left Ear: Tympanic membrane and external ear normal.  Nose: Rhinorrhea present. Right sinus exhibits no maxillary sinus tenderness and no frontal sinus tenderness. Left sinus exhibits no maxillary sinus tenderness and no frontal sinus tenderness.  Mouth/Throat: Uvula is midline and oropharynx is clear and moist. No oropharyngeal exudate.  Eyes: Pupils are equal, round, and reactive to light.  Neck: Normal range of motion. Neck supple. No JVD present.  Cardiovascular: Normal rate and  regular rhythm.   Pulmonary/Chest: Effort normal and breath sounds normal. No respiratory distress. She has no wheezes.  Abdominal: Soft. Bowel sounds are normal. She exhibits no distension. There is no tenderness. There is no guarding.  Lymphadenopathy:       Head (right side): No  submental, no submandibular, no tonsillar and no preauricular adenopathy present.       Head (left side): No submental, no submandibular, no tonsillar and no preauricular adenopathy present.    She has no cervical adenopathy.  Neurological: She is alert and oriented to person, place, and time.  Skin: Skin is warm and dry. Capillary refill takes less than 2 seconds. She is not diaphoretic.  Psychiatric: She has a normal mood and affect. Her behavior is normal.  Nursing note and vitals reviewed.   Urgent Care Course     Procedures (including critical care time)  Labs Review Labs Reviewed - No data to display  Imaging Review No results found.       MDM   1. Viral URI with cough    Treating for viral URI. Prescribed Tessalon for cough, provided counseling on rejected course of symptoms, provided counseling on over-the-counter therapies for symptoms. Advised her to follow-up with primary care, or return to clinic if symptoms persist past one week.    Dorena Bodo, NP 05/15/16 1331

## 2016-05-15 NOTE — Discharge Instructions (Signed)
You most likely have a viral URI, I advise rest, plenty of fluids and management of symptoms with over the counter medicines. For symptoms you may take Tylenol as needed every 4-6 hours for body aches or fever, not to exceed 4,000 mg a day, Take mucinex or mucinex DM ever 12 hours with a full glass of water, you may use an inhaled steroid such as Flonase, 2 sprays each nostril once a day for congestion, or an antihistamine such as Claritin or Zyrtec once a day. For cough, I have prescribed a medication called Tessalon. Take 1 tablet every 8 hours as needed for your cough. Should your symptoms worsen or fail to resolve, follow up with your primary care provider or return to clinic.  °

## 2016-06-20 ENCOUNTER — Encounter: Payer: Self-pay | Admitting: Podiatry

## 2016-06-20 ENCOUNTER — Ambulatory Visit (INDEPENDENT_AMBULATORY_CARE_PROVIDER_SITE_OTHER): Payer: 59

## 2016-06-20 ENCOUNTER — Other Ambulatory Visit: Payer: Self-pay | Admitting: Podiatry

## 2016-06-20 ENCOUNTER — Ambulatory Visit (INDEPENDENT_AMBULATORY_CARE_PROVIDER_SITE_OTHER): Payer: 59 | Admitting: Podiatry

## 2016-06-20 DIAGNOSIS — M79672 Pain in left foot: Secondary | ICD-10-CM

## 2016-06-20 DIAGNOSIS — M779 Enthesopathy, unspecified: Secondary | ICD-10-CM | POA: Diagnosis not present

## 2016-06-21 NOTE — Progress Notes (Signed)
Subjective:    Patient ID: Hannah Burgess, female   DOB: 61 y.o.   MRN: [id number]   HPI patient presents stating that the bottom of the left big toe joint has become very painful and is able to bear slight weight on it but it still is very sore    ROS      Objective:  Physical Exam Neurovascular status found to be intact muscle strength adequate with patient noted to have exquisite discomfort of the sesamoidal complex left with pain around tibial sesamoid with inflammation fluid buildup    Assessment:    Probability for inflammatory stress condition of the left sesamoid complex with possibility for tibial sesamoid fracture     Plan:    H&P x-ray reviewed and today we will applied thick dancer's pad with taping and if symptoms do not resolve will require cast application. Patient will be seen back in 2 weeks to reevaluate or earlier if needed  X-rays indicate there could be possibility of fracture of the left tibial sesamoid even though the fact that symptoms are improving somewhat makes it most likely to be inflammatory condition

## 2017-05-17 ENCOUNTER — Inpatient Hospital Stay (HOSPITAL_COMMUNITY)
Admission: EM | Admit: 2017-05-17 | Discharge: 2017-05-20 | DRG: 418 | Disposition: A | Discharge status: Home / Self Care | Payer: 59 | Attending: Internal Medicine | Admitting: Internal Medicine

## 2017-05-17 ENCOUNTER — Emergency Department (HOSPITAL_COMMUNITY): Payer: 59

## 2017-05-17 ENCOUNTER — Encounter (HOSPITAL_COMMUNITY): Payer: Self-pay | Admitting: *Deleted

## 2017-05-17 ENCOUNTER — Other Ambulatory Visit: Payer: Self-pay

## 2017-05-17 DIAGNOSIS — Z419 Encounter for procedure for purposes other than remedying health state, unspecified: Secondary | ICD-10-CM

## 2017-05-17 DIAGNOSIS — I10 Essential (primary) hypertension: Secondary | ICD-10-CM | POA: Diagnosis not present

## 2017-05-17 DIAGNOSIS — R945 Abnormal results of liver function studies: Secondary | ICD-10-CM | POA: Diagnosis not present

## 2017-05-17 DIAGNOSIS — E669 Obesity, unspecified: Secondary | ICD-10-CM | POA: Diagnosis present

## 2017-05-17 DIAGNOSIS — K8010 Calculus of gallbladder with chronic cholecystitis without obstruction: Secondary | ICD-10-CM | POA: Diagnosis present

## 2017-05-17 DIAGNOSIS — K8500 Idiopathic acute pancreatitis without necrosis or infection: Secondary | ICD-10-CM

## 2017-05-17 DIAGNOSIS — K8020 Calculus of gallbladder without cholecystitis without obstruction: Secondary | ICD-10-CM

## 2017-05-17 DIAGNOSIS — R7989 Other specified abnormal findings of blood chemistry: Secondary | ICD-10-CM | POA: Diagnosis present

## 2017-05-17 DIAGNOSIS — R1032 Left lower quadrant pain: Secondary | ICD-10-CM | POA: Diagnosis present

## 2017-05-17 DIAGNOSIS — K851 Biliary acute pancreatitis without necrosis or infection: Principal | ICD-10-CM

## 2017-05-17 DIAGNOSIS — Z87891 Personal history of nicotine dependence: Secondary | ICD-10-CM

## 2017-05-17 DIAGNOSIS — Z79899 Other long term (current) drug therapy: Secondary | ICD-10-CM

## 2017-05-17 DIAGNOSIS — Z6830 Body mass index (BMI) 30.0-30.9, adult: Secondary | ICD-10-CM

## 2017-05-17 DIAGNOSIS — K859 Acute pancreatitis without necrosis or infection, unspecified: Secondary | ICD-10-CM | POA: Diagnosis present

## 2017-05-17 DIAGNOSIS — E876 Hypokalemia: Secondary | ICD-10-CM | POA: Diagnosis present

## 2017-05-17 HISTORY — DX: Acute pancreatitis without necrosis or infection, unspecified: K85.90

## 2017-05-17 LAB — COMPREHENSIVE METABOLIC PANEL
ALK PHOS: 168 U/L — AB (ref 38–126)
ALT: 142 U/L — ABNORMAL HIGH (ref 14–54)
ANION GAP: 10 (ref 5–15)
AST: 226 U/L — ABNORMAL HIGH (ref 15–41)
Albumin: 3.8 g/dL (ref 3.5–5.0)
BUN: 9 mg/dL (ref 6–20)
CALCIUM: 9.4 mg/dL (ref 8.9–10.3)
CO2: 25 mmol/L (ref 22–32)
Chloride: 105 mmol/L (ref 101–111)
Creatinine, Ser: 0.85 mg/dL (ref 0.44–1.00)
Glucose, Bld: 101 mg/dL — ABNORMAL HIGH (ref 65–99)
Potassium: 3.4 mmol/L — ABNORMAL LOW (ref 3.5–5.1)
Sodium: 140 mmol/L (ref 135–145)
Total Bilirubin: 1.3 mg/dL — ABNORMAL HIGH (ref 0.3–1.2)
Total Protein: 7.3 g/dL (ref 6.5–8.1)

## 2017-05-17 LAB — CBC
HCT: 42.1 % (ref 36.0–46.0)
HEMOGLOBIN: 13.5 g/dL (ref 12.0–15.0)
MCH: 26.9 pg (ref 26.0–34.0)
MCHC: 32.1 g/dL (ref 30.0–36.0)
MCV: 83.9 fL (ref 78.0–100.0)
PLATELETS: 191 10*3/uL (ref 150–400)
RBC: 5.02 MIL/uL (ref 3.87–5.11)
RDW: 13.7 % (ref 11.5–15.5)
WBC: 9.2 10*3/uL (ref 4.0–10.5)

## 2017-05-17 LAB — LIPASE, BLOOD: Lipase: 1282 U/L — ABNORMAL HIGH (ref 11–51)

## 2017-05-17 MED ORDER — SODIUM CHLORIDE 0.9 % IV BOLUS
1000.0000 mL | Freq: Once | INTRAVENOUS | Status: AC
  Administered 2017-05-17: 1000 mL via INTRAVENOUS

## 2017-05-17 MED ORDER — ACETAMINOPHEN 325 MG PO TABS
650.0000 mg | ORAL_TABLET | Freq: Four times a day (QID) | ORAL | Status: DC | PRN
  Administered 2017-05-18 (×2): 650 mg via ORAL
  Filled 2017-05-17 (×2): qty 2

## 2017-05-17 MED ORDER — IOPAMIDOL (ISOVUE-300) INJECTION 61%
INTRAVENOUS | Status: AC
  Administered 2017-05-17: 100 mL
  Filled 2017-05-17: qty 100

## 2017-05-17 MED ORDER — SODIUM CHLORIDE 0.9 % IV SOLN
INTRAVENOUS | Status: AC
  Administered 2017-05-17 – 2017-05-18 (×2): via INTRAVENOUS

## 2017-05-17 MED ORDER — HYDROMORPHONE HCL 1 MG/ML IJ SOLN: 0.5000 mg | INTRAMUSCULAR | Status: DC | PRN

## 2017-05-17 MED ORDER — HYDRALAZINE HCL 20 MG/ML IJ SOLN: 10.0000 mg | INTRAMUSCULAR | Status: DC | PRN

## 2017-05-17 MED ORDER — ENOXAPARIN SODIUM 40 MG/0.4ML ~~LOC~~ SOLN
40.0000 mg | Freq: Every day | SUBCUTANEOUS | Status: DC
  Administered 2017-05-18 – 2017-05-20 (×2): 40 mg via SUBCUTANEOUS
  Filled 2017-05-17 (×2): qty 0.4

## 2017-05-17 MED ORDER — ACETAMINOPHEN 650 MG RE SUPP: 650.0000 mg | Freq: Four times a day (QID) | RECTAL | Status: DC | PRN

## 2017-05-17 MED ORDER — SODIUM CHLORIDE 0.9 % IV SOLN: Freq: Once | INTRAVENOUS | Status: DC

## 2017-05-17 MED ORDER — ONDANSETRON HCL 4 MG PO TABS: 4.0000 mg | ORAL_TABLET | Freq: Four times a day (QID) | ORAL | Status: DC | PRN

## 2017-05-17 MED ORDER — ONDANSETRON HCL 4 MG/2ML IJ SOLN: 4.0000 mg | Freq: Four times a day (QID) | INTRAMUSCULAR | Status: DC | PRN

## 2017-05-17 MED ORDER — ONDANSETRON HCL 4 MG/2ML IJ SOLN
4.0000 mg | Freq: Once | INTRAMUSCULAR | Status: AC
  Administered 2017-05-17: 4 mg via INTRAVENOUS
  Filled 2017-05-17: qty 2

## 2017-05-17 MED ORDER — HYDROMORPHONE HCL 1 MG/ML IJ SOLN
0.5000 mg | Freq: Once | INTRAMUSCULAR | Status: AC
  Administered 2017-05-17: 0.5 mg via INTRAVENOUS
  Filled 2017-05-17: qty 1

## 2017-05-17 MED ORDER — POTASSIUM CHLORIDE 10 MEQ/100ML IV SOLN
10.0000 meq | INTRAVENOUS | Status: AC
  Administered 2017-05-17 (×2): 10 meq via INTRAVENOUS
  Filled 2017-05-17 (×2): qty 100

## 2017-05-17 MED ORDER — FAMOTIDINE IN NACL 20-0.9 MG/50ML-% IV SOLN
20.0000 mg | Freq: Two times a day (BID) | INTRAVENOUS | Status: DC
  Administered 2017-05-18 – 2017-05-19 (×5): 20 mg via INTRAVENOUS
  Filled 2017-05-17 (×6): qty 50

## 2017-05-17 NOTE — Consult Note (Signed)
Reason for Consult: Gallstone pancreatitis Referring Physician: Dr. Windell Norfolk Hannah Burgess is an 62 y.o. female.  HPI:  Pt is a 62 yo F who we are asked to consult upon regarding gallstone pancreatitis. Pt has had 3 months of intermittent epigastric discomfort and bloating.  Most recently, she had an episode 4 days ago. Each time she has had these episodes, she has thought she had a virus that was self limited.  However, today, instead of trailing off, she got much more intense pain, nausea, and vomiting.  She denies fever/chills.  She denies jaundice.  She has not had constipation or diarrhea.  She also has not had any bloody bowel movements or emesis.  No SOB/CP. She has a mother who had gallbladder disease.    Past Medical History:  Diagnosis Date  . Hypertension     History reviewed. No pertinent surgical history.  Tubal ligation.    History reviewed. No pertinent family history.  Social History:  reports that she has quit smoking. She has never used smokeless tobacco. She reports that she does not drink alcohol or use drugs.  Allergies:  Allergies  Allergen Reactions  . Penicillins Rash    Has patient had a PCN reaction causing immediate rash, facial/tongue/throat swelling, SOB or lightheadedness with hypotension: Yes Has patient had a PCN reaction causing severe rash involving mucus membranes or skin necrosis: No Has patient had a PCN reaction that required hospitalization No Has patient had a PCN reaction occurring within the last 10 years: No If all of the above answers are "NO", then may proceed with Cephalosporin use.     Medications: Current Facility-Administered Medications for the 05/17/17 encounter [facility] Encounter)  Medication  . triamcinolone acetonide (KENALOG) 10 MG/ML injection 10 mg   Current Meds  Medication Sig  . amLODipine (NORVASC) 10 MG tablet Take 10 mg by mouth daily.      Results for orders placed or performed during the hospital encounter of  05/17/17 (from the past 48 hour(s))  Lipase, blood     Status: Abnormal   Collection Time: 05/17/17  1:57 PM  Result Value Ref Range   Lipase 1,282 (H) 11 - 51 U/L    Comment: RESULTS CONFIRMED BY MANUAL DILUTION Performed at [facility], [address], [place], [place] [address]   Comprehensive metabolic panel     Status: Abnormal   Collection Time: 05/17/17  1:57 PM  Result Value Ref Range   Sodium 140 135 - 145 mmol/L   Potassium 3.4 (L) 3.5 - 5.1 mmol/L   Chloride 105 101 - 111 mmol/L   CO2 25 22 - 32 mmol/L   Glucose, Bld 101 (H) 65 - 99 mg/dL   BUN 9 6 - 20 mg/dL   Creatinine, Ser 0.85 0.44 - 1.00 mg/dL   Calcium 9.4 8.9 - 10.3 mg/dL   Total Protein 7.3 6.5 - 8.1 g/dL   Albumin 3.8 3.5 - 5.0 g/dL   AST 226 (H) 15 - 41 U/L   ALT 142 (H) 14 - 54 U/L   Alkaline Phosphatase 168 (H) 38 - 126 U/L   Total Bilirubin 1.3 (H) 0.3 - 1.2 mg/dL   GFR calc non Af Amer >60 >60 mL/min   GFR calc Af Amer >60 >60 mL/min    Comment: (NOTE) The eGFR has been calculated using the CKD EPI equation. This calculation has not been validated in all clinical situations. eGFR's persistently <60 mL/min signify possible Chronic Kidney Disease.  Anion gap 10 5 - 15    Comment: Performed at [facility] [address] [place] [address]  CBC     Status: None   Collection Time: 05/17/17  1:57 PM  Result Value Ref Range   WBC 9.2 4.0 - 10.5 K/uL   RBC 5.02 3.87 - 5.11 MIL/uL   Hemoglobin 13.5 12.0 - 15.0 g/dL   HCT 42.1 36.0 - 46.0 %   MCV 83.9 78.0 - 100.0 fL   MCH 26.9 26.0 - 34.0 pg   MCHC 32.1 30.0 - 36.0 g/dL   RDW 13.7 11.5 - 15.5 %   Platelets 191 150 - 400 K/uL    Comment: Performed at [facility], [place] [address], [place], [place] [address]    Ct Abdomen Pelvis W Contrast  Result Date: 05/17/2017 CLINICAL DATA:  Epigastric and left lower quadrant pain with vomiting. Elevated lipase. EXAM: CT ABDOMEN AND PELVIS WITH CONTRAST TECHNIQUE: Multidetector CT  imaging of the abdomen and pelvis was performed using the standard protocol following bolus administration of intravenous contrast. CONTRAST:  132m ISOVUE-300 IOPAMIDOL (ISOVUE-300) INJECTION 61% COMPARISON:  09/23/2015 FINDINGS: Lower chest: No acute abnormality. Hepatobiliary: Tiny dependent gallstones are noted without secondary signs of cholecystitis. No space-occupying mass of the liver. No biliary dilatation. Pancreas: Peripancreatic inflammatory changes are noted. No evidence of pancreatic necrosis or ductal dilatation. No enhancing mass lesions. Fluid along the right lateral conal fascia is seen. Spleen: Punctate calcifications/granulomas within the normal size spleen. Adrenals/Urinary Tract: Unremarkable bilateral adrenal glands and kidneys apart from what may represent a 5 mm cyst in lower pole the right kidney. No obstructive uropathy. The urinary bladder is unremarkable for degree of distention. Stomach/Bowel: Decompressed stomach. No bowel obstruction or free air. Inflammatory changes in the right upper quadrant are noted adjacent to the second and third portion of the duodenum likely the patient's pancreatic inflammation although the possibility of peptic ulcer disease could have a similar appearance. Scattered colonic diverticulosis without acute diverticulitis more so along the left colon. Vascular/Lymphatic: Aortic atherosclerosis. No aneurysm. No enlarged abdominal or pelvic lymph nodes. Reproductive: Intramural and subserosal uterine fibroids are redemonstrated. Two are noted on the right measuring between 14 and 26 mm and one on the left measuring 38 mm. No adnexal mass. Other: Tiny fat containing umbilical hernia.  No free air. Musculoskeletal: L3 vertebral body hemangioma. Mild disc space narrowing L3-4. IMPRESSION: 1. Peripancreatic inflammation consistent with acute uncomplicated pancreatitis given the patient's elevated lipase. 2. Uncomplicated cholelithiasis.  No choledocholithiasis. 3.  Stable fibroid uterus. Electronically Signed   By: DAshley RoyaltyM.D.   On: 05/17/2017 18:47    Review of Systems  Constitutional: Negative.   HENT: Negative.   Eyes: Negative.   Respiratory: Negative.   Cardiovascular: Negative.   Gastrointestinal: Positive for abdominal pain, nausea and vomiting. Negative for blood in stool, constipation and diarrhea.  Genitourinary: Negative.   Musculoskeletal: Negative.   Skin: Negative.   Neurological: Negative.   Endo/Heme/Allergies: Negative.   Psychiatric/Behavioral: Negative.    Blood pressure (!) 146/87, pulse 69, temperature 98.1 F (36.7 C), temperature source Oral, resp. rate 18, SpO2 93 %. Physical Exam  Constitutional: She is oriented to person, place, and time. She appears well-developed and well-nourished. No distress.  HENT:  Head: Normocephalic and atraumatic.  Right Ear: External ear normal.  Left Ear: External ear normal.  Eyes: Pupils are equal, round, and reactive to light. Conjunctivae are normal. Right eye exhibits no discharge. Left eye  exhibits no discharge. No scleral icterus.  Neck: Neck supple. No tracheal deviation present. No thyromegaly present.  Cardiovascular: Normal rate, regular rhythm, normal heart sounds and intact distal pulses. Exam reveals no gallop and no friction rub.  No murmur heard. Respiratory: Effort normal and breath sounds normal. No respiratory distress. She has no wheezes. She has no rales. She exhibits no tenderness.  GI: Soft. She exhibits distension (mild distention). She exhibits no mass. There is tenderness (mild to moderate LUQ and epigastric tenderness.  No RUQ tenderness). There is no rebound and no guarding.  Musculoskeletal: Normal range of motion. She exhibits no edema, tenderness or deformity.  Neurological: She is alert and oriented to person, place, and time. Coordination normal.  Skin: Skin is warm and dry. No rash noted. She is not diaphoretic. No erythema. No pallor.  Psychiatric:  She has a normal mood and affect. Her behavior is normal. Judgment and thought content normal.    Assessment/Plan: Gallstone pancreatitis HTN Hypokalemia Elevated liver function tests.   NPO x ice chips Supportive care for pancreatitis including IVF and repletion of electrolytes.   Once resolved, will plan lap chole. General surgery to follow Watch liver function tests as she may develop hyperbilirubinemia and be at risk for cholangitis.    Hannah Burgess 05/17/2017, 9:14 PM

## 2017-05-17 NOTE — ED Provider Notes (Signed)
[facility] EMERGENCY DEPARTMENT Provider Note   CSN: [id number] Arrival date & time: 05/17/17  1301     History   Chief Complaint Chief Complaint  Patient presents with  . Abdominal Pain  . Emesis    HPI Hannah Burgess is a 62 y.o. female.  The history is provided by the patient.  Abdominal Pain   This is a recurrent problem. Episode onset: first on wed, then again today. The problem occurs constantly. The problem has been gradually worsening. The pain is located in the epigastric region and LLQ. The quality of the pain is aching. The pain is at a severity of 7/10. The pain is moderate. Associated symptoms include anorexia, nausea, vomiting and constipation. Pertinent negatives include fever, diarrhea, melena, dysuria, frequency, hematuria and headaches.    Past Medical History:  Diagnosis Date  . Hypertension     There are no active problems to display for this patient.   History reviewed. No pertinent surgical history.   OB History   None      Home Medications    Prior to Admission medications   Medication Sig Start Date End Date Taking? Authorizing Provider  amLODipine (NORVASC) 10 MG tablet Take 10 mg by mouth daily.  03/26/16  Yes [provider]    Family History No family history on file.  Social History Social History   Tobacco Use  . Smoking status: Former Games developer  . Smokeless tobacco: Never Used  Substance Use Topics  . Alcohol use: No  . Drug use: No     Allergies   Penicillins   Review of Systems Review of Systems  Constitutional: Positive for appetite change. Negative for chills and fever.  HENT: Negative.   Eyes: Negative for visual disturbance.  Respiratory: Negative for cough and shortness of breath.   Cardiovascular: Negative for chest pain.  Gastrointestinal: Positive for abdominal pain, anorexia, constipation, nausea and vomiting. Negative for abdominal distention, blood in stool, diarrhea and  melena.  Genitourinary: Negative for dysuria, flank pain, frequency and hematuria.  Musculoskeletal: Negative for back pain and neck pain.  Skin: Negative for rash.  Neurological: Negative for light-headedness and headaches.  Psychiatric/Behavioral: Negative for confusion.  All other systems reviewed and are negative.    Physical Exam Updated Vital Signs BP 135/79 (BP Location: Right Arm)   Pulse 69   Temp 98.1 F (36.7 C) (Oral)   Resp 18   SpO2 99%   Physical Exam  Constitutional: She appears well-developed and well-nourished.  Non-toxic appearance. No distress.  HENT:  Head: Normocephalic and atraumatic.  Mouth/Throat: Oropharynx is clear and moist.  Eyes: Conjunctivae are normal. No scleral icterus.  Neck: Neck supple.  Cardiovascular: Normal rate and regular rhythm.  No murmur heard. Pulmonary/Chest: Effort normal and breath sounds normal. No respiratory distress.  Abdominal: Soft. There is tenderness in the epigastric area and left lower quadrant. There is no rigidity, no rebound, no guarding, no CVA tenderness and negative Murphy's sign.  Musculoskeletal: She exhibits no edema.  Neurological: She is alert.  Skin: Skin is warm and dry.  Psychiatric: She has a normal mood and affect.  Nursing note and vitals reviewed.    ED Treatments / Results  Labs (all labs ordered are listed, but only abnormal results are displayed) Labs Reviewed  LIPASE, BLOOD - Abnormal; Notable for the following components:      Result Value   Lipase 1,282 (*)    All other components within normal limits  COMPREHENSIVE METABOLIC  PANEL - Abnormal; Notable for the following components:   Potassium 3.4 (*)    Glucose, Bld 101 (*)    AST 226 (*)    ALT 142 (*)    Alkaline Phosphatase 168 (*)    Total Bilirubin 1.3 (*)    All other components within normal limits  CBC  URINALYSIS, ROUTINE W REFLEX MICROSCOPIC  HIV ANTIBODY (ROUTINE TESTING)  COMPREHENSIVE METABOLIC PANEL  MAGNESIUM    LIPASE, BLOOD  CBC WITH DIFFERENTIAL/PLATELET    EKG None  Radiology Ct Abdomen Pelvis W Contrast  Result Date: 05/17/2017 CLINICAL DATA:  Epigastric and left lower quadrant pain with vomiting. Elevated lipase. EXAM: CT ABDOMEN AND PELVIS WITH CONTRAST TECHNIQUE: Multidetector CT imaging of the abdomen and pelvis was performed using the standard protocol following bolus administration of intravenous contrast. CONTRAST:  100mL ISOVUE-300 IOPAMIDOL (ISOVUE-300) INJECTION 61% COMPARISON:  09/23/2015 FINDINGS: Lower chest: No acute abnormality. Hepatobiliary: Tiny dependent gallstones are noted without secondary signs of cholecystitis. No space-occupying mass of the liver. No biliary dilatation. Pancreas: Peripancreatic inflammatory changes are noted. No evidence of pancreatic necrosis or ductal dilatation. No enhancing mass lesions. Fluid along the right lateral conal fascia is seen. Spleen: Punctate calcifications/granulomas within the normal size spleen. Adrenals/Urinary Tract: Unremarkable bilateral adrenal glands and kidneys apart from what may represent a 5 mm cyst in lower pole the right kidney. No obstructive uropathy. The urinary bladder is unremarkable for degree of distention. Stomach/Bowel: Decompressed stomach. No bowel obstruction or free air. Inflammatory changes in the right upper quadrant are noted adjacent to the second and third portion of the duodenum likely the patient's pancreatic inflammation although the possibility of peptic ulcer disease could have a similar appearance. Scattered colonic diverticulosis without acute diverticulitis more so along the left colon. Vascular/Lymphatic: Aortic atherosclerosis. No aneurysm. No enlarged abdominal or pelvic lymph nodes. Reproductive: Intramural and subserosal uterine fibroids are redemonstrated. Two are noted on the right measuring between 14 and 26 mm and one on the left measuring 38 mm. No adnexal mass. Other: Tiny fat containing umbilical  hernia.  No free air. Musculoskeletal: L3 vertebral body hemangioma. Mild disc space narrowing L3-4. IMPRESSION: 1. Peripancreatic inflammation consistent with acute uncomplicated pancreatitis given the patient's elevated lipase. 2. Uncomplicated cholelithiasis.  No choledocholithiasis. 3. Stable fibroid uterus. Electronically Signed   By: Tollie Ethavid  Kwon M.D.   On: 05/17/2017 18:47    Procedures Procedures (including critical care time)  Medications Ordered in ED Medications  sodium chloride 0.9 % bolus 1,000 mL (1,000 mLs Intravenous New Bag/Given 05/17/17 1744)  HYDROmorphone (DILAUDID) injection 0.5 mg (has no administration in time range)  iopamidol (ISOVUE-300) 61 % injection (has no administration in time range)  ondansetron (ZOFRAN) injection 4 mg (4 mg Intravenous Given 05/17/17 1744)     Initial Impression / Assessment and Plan / ED Course  I have reviewed the triage vital signs and the nursing notes.  Pertinent labs & imaging results that were available during my care of the patient were reviewed by me and considered in my medical decision making (see chart for details).     Patient is a 62 year old female with history of hypertension presenting with abdominal pain and vomiting.  She has epigastric tenderness and left lower quadrant tenderness on exam.  Patient given IV fluids, Dilaudid, Zofran.  Labs as above significant for elevated liver enzymes, T bili of 1.3, lipase of 1282.  Patient will get a CT scan as well of her abdomen and pelvis.  Abnormalities likely secondary to  gallstone pancreatitis.  CT scan showed uncompleted pancreatitis.  She has cholelithiasis without choledocholithiasis.  I spoke with general surgery and they recommended medicine admission and they will remove her gallbladder once the pancreatitis is resolved.  Hospitalist is consulted for admission.     Final Clinical Impressions(s) / ED Diagnoses   Final diagnoses:  Idiopathic acute pancreatitis,  unspecified complication status  Calculus of gallbladder without cholecystitis without obstruction    ED Discharge Orders    None       Dwana Melena, DO 05/18/17 1610    Blane Ohara, MD 05/18/17 0126

## 2017-05-17 NOTE — H&P (Signed)
History and Physical    Hannah Burgess MVH:[name] DOB: [birth year] DOA: 05/17/2017  PCP: Maurice SmallGriffin, Elaine, MD   Patient coming from: Home  Chief Complaint: Severe abdominal pain, nausea, vomiting   HPI: Hannah Burgess is a 62 y.o. female with medical history significant for hypertension, now presenting to the emergency department for evaluation of severe abdominal pain, nausea, and one episode of nonbloody vomiting.  Patient reports that she had been in her usual state of health until 05/13/2017 when she experienced an episode of acute abdominal pain that resolved and and then returned today, more severe than the recent episode.  There is associated nausea with one episode of nonbloody vomiting.  Denies fevers or chills, chest pain, shortness of breath, or cough.  Denies diarrhea, melena, or hematochezia.  She denies regular alcohol use, but reports having 1 drink on 05/15/2019 for her birthday.  She had never experienced these symptoms previously.  ED Course: Upon arrival to the ED, patient is found to be afebrile, saturating well on room air, and with vitals otherwise stable.  Chemistry panel is notable for potassium 3.4, alkaline phosphatase 168, AST 226, ALT 142, and total bilirubin of 1.3.  CBC is unremarkable.  Lipase is elevated to 1282.  CT of the abdomen and pelvis reveals peripancreatic inflammation consistent with acute pancreatitis, as well as cholelithiasis without acute cholecystitis or choledocholithiasis.  1 L of normal saline and 0.5 mg IV Dilaudid were administered in the ED.  ED physician discussed the case with general surgery.  Patient remains hemodynamically stable, is not in any respiratory distress, and will be admitted to the medical-surgical unit for ongoing evaluation and management of acute pancreatitis, likely biliary.  Review of Systems:  All other systems reviewed and apart from HPI, are negative.  Past Medical History:  Diagnosis Date  . Hypertension     History  reviewed. No pertinent surgical history.   reports that she has quit smoking. She has never used smokeless tobacco. She reports that she does not drink alcohol or use drugs.  Allergies  Allergen Reactions  . Penicillins Rash    Has patient had a PCN reaction causing immediate rash, facial/tongue/throat swelling, SOB or lightheadedness with hypotension: Yes Has patient had a PCN reaction causing severe rash involving mucus membranes or skin necrosis: No Has patient had a PCN reaction that required hospitalization No Has patient had a PCN reaction occurring within the last 10 years: No If all of the above answers are "NO", then may proceed with Cephalosporin use.     History reviewed. No pertinent family history.   Prior to Admission medications   Medication Sig Start Date End Date Taking? Authorizing Provider  amLODipine (NORVASC) 10 MG tablet Take 10 mg by mouth daily.  03/26/16  Yes [provider]    Physical Exam: Vitals:   05/17/17 1354 05/17/17 1800 05/17/17 1845 05/17/17 1900  BP: 135/79 (!) 142/74 (!) 148/75 (!) 146/87  Pulse: 69 65 81 69  Resp: 18     Temp: 98.1 F (36.7 C)     TempSrc: Oral     SpO2: 99% 100% 99% 93%      Constitutional: NAD, calm, appears uncomfortable  Eyes: PERTLA, lids and conjunctivae normal ENMT: Mucous membranes are moist. Posterior pharynx clear of any exudate or lesions.   Neck: normal, supple, no masses, no thyromegaly Respiratory: clear to auscultation bilaterally, no wheezing, no crackles. Normal respiratory effort.    Cardiovascular: S1 & S2 heard, regular rate and rhythm. No significant  JVD. Abdomen: No distension, soft, mid- and upper abdominal tenderness without rebound pain or guarding. Bowel sounds active.  Musculoskeletal: no clubbing / cyanosis. No joint deformity upper and lower extremities.   Skin: no significant rashes, lesions, ulcers. Warm, dry, well-perfused. Neurologic: CN 2-12 grossly intact. Sensation intact.  Strength 5/5 in all 4 limbs.  Psychiatric: Alert and oriented x 3. Pleasant, cooperative.     Labs on Admission: I have personally reviewed following labs and imaging studies  CBC: Recent Labs  Lab 05/17/17 1357  WBC 9.2  HGB 13.5  HCT 42.1  MCV 83.9  PLT 191   Basic Metabolic Panel: Recent Labs  Lab 05/17/17 1357  NA 140  K 3.4*  CL 105  CO2 25  GLUCOSE 101*  BUN 9  CREATININE 0.85  CALCIUM 9.4   GFR: CrCl cannot be calculated (Unknown ideal weight.). Liver Function Tests: Recent Labs  Lab 05/17/17 1357  AST 226*  ALT 142*  ALKPHOS 168*  BILITOT 1.3*  PROT 7.3  ALBUMIN 3.8   Recent Labs  Lab 05/17/17 1357  LIPASE 1,282*   No results for input(s): AMMONIA in the last 168 hours. Coagulation Profile: No results for input(s): INR, PROTIME in the last 168 hours. Cardiac Enzymes: No results for input(s): CKTOTAL, CKMB, CKMBINDEX, TROPONINI in the last 168 hours. BNP (last 3 results) No results for input(s): PROBNP in the last 8760 hours. HbA1C: No results for input(s): HGBA1C in the last 72 hours. CBG: No results for input(s): GLUCAP in the last 168 hours. Lipid Profile: No results for input(s): CHOL, HDL, LDLCALC, TRIG, CHOLHDL, LDLDIRECT in the last 72 hours. Thyroid Function Tests: No results for input(s): TSH, T4TOTAL, FREET4, T3FREE, THYROIDAB in the last 72 hours. Anemia Panel: No results for input(s): VITAMINB12, FOLATE, FERRITIN, TIBC, IRON, RETICCTPCT in the last 72 hours. Urine analysis:    Component Value Date/Time   COLORURINE YELLOW 09/23/2015 1530   APPEARANCEUR TURBID (A) 09/23/2015 1530   LABSPEC 1.019 09/23/2015 1530   PHURINE 8.5 (H) 09/23/2015 1530   GLUCOSEU NEGATIVE 09/23/2015 1530   HGBUR MODERATE (A) 09/23/2015 1530   BILIRUBINUR NEGATIVE 09/23/2015 1530   KETONESUR NEGATIVE 09/23/2015 1530   PROTEINUR NEGATIVE 09/23/2015 1530   UROBILINOGEN 1.0 02/23/2013 0919   NITRITE NEGATIVE 09/23/2015 1530   LEUKOCYTESUR MODERATE  (A) 09/23/2015 1530   Sepsis Labs: @LABRCNTIP (procalcitonin:4,lacticidven:4) )No results found for this or any previous visit (from the past 240 hour(s)).   Radiological Exams on Admission: Ct Abdomen Pelvis W Contrast  Result Date: 05/17/2017 CLINICAL DATA:  Epigastric and left lower quadrant pain with vomiting. Elevated lipase. EXAM: CT ABDOMEN AND PELVIS WITH CONTRAST TECHNIQUE: Multidetector CT imaging of the abdomen and pelvis was performed using the standard protocol following bolus administration of intravenous contrast. CONTRAST:  ISOVUE-300 IOPAMIDOL (ISOVUE-300) INJECTION 61% COMPARISON:  09/23/2015 FINDINGS: Lower chest: No acute abnormality. Hepatobiliary: Tiny dependent gallstones are noted without secondary signs of cholecystitis. No space-occupying mass of the liver. No biliary dilatation. Pancreas: Peripancreatic inflammatory changes are noted. No evidence of pancreatic necrosis or ductal dilatation. No enhancing mass lesions. Fluid along the right lateral conal fascia is seen. Spleen: Punctate calcifications/granulomas within the normal size spleen. Adrenals/Urinary Tract: Unremarkable bilateral adrenal glands and kidneys apart from what may represent a 5 mm cyst in lower pole the right kidney. No obstructive uropathy. The urinary bladder is unremarkable for degree of distention. Stomach/Bowel: Decompressed stomach. No bowel obstruction or free air. Inflammatory changes in the right upper quadrant are noted adjacent  to the second and third portion of the duodenum likely the patient's pancreatic inflammation although the possibility of peptic ulcer disease could have a similar appearance. Scattered colonic diverticulosis without acute diverticulitis more so along the left colon. Vascular/Lymphatic: Aortic atherosclerosis. No aneurysm. No enlarged abdominal or pelvic lymph nodes. Reproductive: Intramural and subserosal uterine fibroids are redemonstrated. Two are noted on the right  measuring between 14 and 26 mm and one on the left measuring 38 mm. No adnexal mass. Other: Tiny fat containing umbilical hernia.  No free air. Musculoskeletal: L3 vertebral body hemangioma. Mild disc space narrowing L3-4. IMPRESSION: 1. Peripancreatic inflammation consistent with acute uncomplicated pancreatitis given the patient's elevated lipase. 2. Uncomplicated cholelithiasis.  No choledocholithiasis. 3. Stable fibroid uterus. Electronically Signed   By: Tollie Eth M.D.   On: 05/17/2017 18:47    EKG: Not performed.   Assessment/Plan   1. Acute biliary pancreatitis  - Presents with severe abdominal pain with nausea and one episode of non-bloody vomiting - Labs notable for lipase 1282, AST 226, ALT 143, and t bili 1.3  - CT abd/pelvis with peripancreatic inflammation and cholelithiasis without acute cholecystitis or choledocholithiasis  - Likely biliary pancreatitis with passed stone; BISAP score 1   - Treated in ED with 1 liter NS and IV Dilaudid  - Continue IVF, bowel-rest, prn analgesia and antiemetic, culture if febrile  - ED physician discussed with surgery; will likely need to formally consult once acute inflammation subsiding   2. Hypertension  - BP at goal  - Managed with Norvasc at home, currently NPO  - Use hydralazine IVP's prn for now     DVT prophylaxis: Lovenox Code Status: Full  Family Communication: Family updated at bedside Consults called: EDP discussed with surgery  Admission status: Inpatient    Briscoe Deutscher, MD [facility] Pager [phone]  If 7PM-7AM, please contact night-coverage [email or web address] Password [facility]  05/17/2017, 7:52 PM

## 2017-05-17 NOTE — ED Triage Notes (Signed)
Pt is here with left lower abdominal pain on Wednesday and then went away and came back today.  Pt reports vomiting. No back pain. Pt denies constipation or diarrhea.  No vaginal discharge or bleeding

## 2017-05-18 ENCOUNTER — Inpatient Hospital Stay (HOSPITAL_COMMUNITY): Payer: 59

## 2017-05-18 ENCOUNTER — Other Ambulatory Visit: Payer: Self-pay

## 2017-05-18 ENCOUNTER — Encounter (HOSPITAL_COMMUNITY): Payer: Self-pay | Admitting: General Practice

## 2017-05-18 DIAGNOSIS — K859 Acute pancreatitis without necrosis or infection, unspecified: Secondary | ICD-10-CM

## 2017-05-18 DIAGNOSIS — K8500 Idiopathic acute pancreatitis without necrosis or infection: Secondary | ICD-10-CM

## 2017-05-18 HISTORY — DX: Acute pancreatitis without necrosis or infection, unspecified: K85.90

## 2017-05-18 LAB — COMPREHENSIVE METABOLIC PANEL
ALK PHOS: 144 U/L — AB (ref 38–126)
ALT: 89 U/L — AB (ref 14–54)
AST: 78 U/L — ABNORMAL HIGH (ref 15–41)
Albumin: 3.2 g/dL — ABNORMAL LOW (ref 3.5–5.0)
Anion gap: 8 (ref 5–15)
BUN: 9 mg/dL (ref 6–20)
CALCIUM: 8.3 mg/dL — AB (ref 8.9–10.3)
CO2: 24 mmol/L (ref 22–32)
CREATININE: 0.82 mg/dL (ref 0.44–1.00)
Chloride: 108 mmol/L (ref 101–111)
GFR calc non Af Amer: >60 mL/min (ref >60)
GLUCOSE: 93 mg/dL (ref 65–99)
Potassium: 3.3 mmol/L — ABNORMAL LOW (ref 3.5–5.1)
SODIUM: 140 mmol/L (ref 135–145)
Total Bilirubin: 0.9 mg/dL (ref 0.3–1.2)
Total Protein: 6 g/dL — ABNORMAL LOW (ref 6.5–8.1)

## 2017-05-18 LAB — CBC WITH DIFFERENTIAL/PLATELET
Basophils Absolute: 0 10*3/uL (ref 0.0–0.1)
Basophils Relative: 0 %
EOS PCT: 1 %
Eosinophils Absolute: 0.1 10*3/uL (ref 0.0–0.7)
HCT: 36.5 % (ref 36.0–46.0)
Hemoglobin: 11.9 g/dL — ABNORMAL LOW (ref 12.0–15.0)
LYMPHS PCT: 27 %
Lymphs Abs: 2.3 10*3/uL (ref 0.7–4.0)
MCH: 27.7 pg (ref 26.0–34.0)
MCHC: 32.6 g/dL (ref 30.0–36.0)
MCV: 84.9 fL (ref 78.0–100.0)
MONOS PCT: 5 %
Monocytes Absolute: 0.5 10*3/uL (ref 0.1–1.0)
Neutro Abs: 5.8 10*3/uL (ref 1.7–7.7)
Neutrophils Relative %: 67 %
PLATELETS: 169 10*3/uL (ref 150–400)
RBC: 4.3 MIL/uL (ref 3.87–5.11)
RDW: 13.9 % (ref 11.5–15.5)
WBC: 8.6 10*3/uL (ref 4.0–10.5)

## 2017-05-18 LAB — URINALYSIS, ROUTINE W REFLEX MICROSCOPIC
Bacteria, UA: NONE SEEN
Bilirubin Urine: NEGATIVE
Glucose, UA: NEGATIVE mg/dL
Ketones, ur: NEGATIVE mg/dL
Nitrite: NEGATIVE
PROTEIN: NEGATIVE mg/dL
Specific Gravity, Urine: 1.017 (ref 1.005–1.030)
pH: 6 (ref 5.0–8.0)

## 2017-05-18 LAB — MAGNESIUM: Magnesium: 1.9 mg/dL (ref 1.7–2.4)

## 2017-05-18 LAB — GLUCOSE, CAPILLARY: Glucose-Capillary: 84 mg/dL (ref 65–99)

## 2017-05-18 LAB — HIV ANTIBODY (ROUTINE TESTING W REFLEX): HIV Screen 4th Generation wRfx: NONREACTIVE

## 2017-05-18 LAB — LIPASE, BLOOD: LIPASE: 270 U/L — AB (ref 11–51)

## 2017-05-18 MED ORDER — POTASSIUM CHLORIDE IN NACL 20-0.9 MEQ/L-% IV SOLN
INTRAVENOUS | Status: DC
  Administered 2017-05-18 – 2017-05-19 (×2): via INTRAVENOUS
  Filled 2017-05-18 (×3): qty 1000

## 2017-05-18 MED ORDER — SODIUM CHLORIDE 0.9 % IV SOLN: INTRAVENOUS | Status: DC

## 2017-05-18 NOTE — Progress Notes (Signed)
Progress Note    Hannah Burgess  ZOX:[id number] DOB: 18-Feb-1955  DOA: 05/17/2017 PCP: Maurice Small, MD    Brief Narrative:   Chief complaint: Follow-up abdominal pain  Medical records reviewed and are as summarized below:  Hannah Burgess is an 62 y.o. female with a PMH of hypertension who was admitted 05/17/17 for evaluation of severe abdominal pain associated with nausea and vomiting. Upon evaluation in the ED, her lipase was noted to be 1282 and a CT of the abdomen/pelvis revealed peripancreatic inflammation consistent with acute pancreatitis as well as cholelithiasis without acute cholecystitis or choledocholithiasis. General surgery was consulted.  Assessment/Plan:   Principal Problem:   Acute gallstone pancreatitis/elevated LFTs/cholelithiasis Imaging done on admission in concert with elevated lipase levels confirm diagnosis. General surgery consulted for consideration of a cholecystectomy. Continue supportive care with bowel rest, IV fluids, and pain/nausea management as needed. Likely will have a laparoscopic cholecystectomy when stable from a surgical perspective. Reorder IV fluids at 125 mL per hour. LFTs/lipase improving.  Active Problems:   Hypokalemia Likely from GI losses. Add potassium to IV fluids.    Hypertension Norvasc currently on hold.    Obesity Body mass index is 30.72 kg/m.   Family Communication/Anticipated D/C date and plan/Code Status   DVT prophylaxis: Lovenox ordered. Code Status: Full Code.  Family Communication: Husband updated bedside Disposition Plan: Laparoscopic cholecystectomy planned for 05/19/17. If uneventful, can likely discharge 05/20/17.   Medical Consultants:    Surgery   Anti-Infectives:    None  Subjective:   Denies current complaints of abdominal pain, nausea or vomiting.  Objective:    Vitals:   05/18/17 0115 05/18/17 0137 05/18/17 0630 05/18/17 0755  BP: 128/80  108/90 132/76  Pulse: 69  78 73  Resp:     18  Temp:  98.4 F (36.9 C)  98.2 F (36.8 C)  TempSrc:  Oral  Oral  SpO2: 96%  100% 98%  Weight:    99.9 kg (220 lb 3.8 oz)  Height:    5\' 11"  (1.803 m)    Intake/Output Summary (Last 24 hours) at 05/18/2017 0917 Last data filed at 05/18/2017 0033 Gross per 24 hour  Intake 2593.43 ml  Output -  Net 2593.43 ml   Filed Weights   05/18/17 0755  Weight: 99.9 kg (220 lb 3.8 oz)    Exam: General: No acute distress. Cardiovascular: Heart sounds show a regular rate, and rhythm. No gallops or rubs. No murmurs. No JVD. Lungs: Clear to auscultation bilaterally with good air movement. No rales, rhonchi or wheezes. Abdomen: Soft, nontender, nondistended with normal active bowel sounds. No masses. No hepatosplenomegaly. Skin: Warm and dry. No rashes or lesions. Extremities: No clubbing or cyanosis. No edema. Pedal pulses 2+.  Data Reviewed:   I have personally reviewed following labs and imaging studies:  Labs: Labs show the following:   Basic Metabolic Panel: Recent Labs  Lab 05/17/17 1357 05/18/17 0146  NA 140 140  K 3.4* 3.3*  CL 105 108  CO2 25 24  GLUCOSE 101* 93  BUN 9 9  CREATININE 0.85 0.82  CALCIUM 9.4 8.3*  MG  --  1.9   GFR Estimated Creatinine Clearance: 90.2 mL/min (by C-G formula based on SCr of 0.82 mg/dL). Liver Function Tests: Recent Labs  Lab 05/17/17 1357 05/18/17 0146  AST 226* 78*  ALT 142* 89*  ALKPHOS 168* 144*  BILITOT 1.3* 0.9  PROT 7.3 6.0*  ALBUMIN 3.8 3.2*   Recent Labs  Lab  05/17/17 1357 05/18/17 0146  LIPASE 1,282* 270*    CBC: Recent Labs  Lab 05/17/17 1357 05/18/17 0146  WBC 9.2 8.6  NEUTROABS  --  5.8  HGB 13.5 11.9*  HCT 42.1 36.5  MCV 83.9 84.9  PLT 191 169   CBG: Recent Labs  Lab 05/18/17 0819  GLUCAP 84    Microbiology No results found for this or any previous visit (from the past 240 hour(s)).  Procedures and diagnostic studies:  Ct Abdomen Pelvis W Contrast  Result Date: 05/17/2017 CLINICAL DATA:   Epigastric and left lower quadrant pain with vomiting. Elevated lipase. EXAM: CT ABDOMEN AND PELVIS WITH CONTRAST TECHNIQUE: Multidetector CT imaging of the abdomen and pelvis was performed using the standard protocol following bolus administration of intravenous contrast. CONTRAST:  100mL ISOVUE-300 IOPAMIDOL (ISOVUE-300) INJECTION 61% COMPARISON:  09/23/2015 FINDINGS: Lower chest: No acute abnormality. Hepatobiliary: Tiny dependent gallstones are noted without secondary signs of cholecystitis. No space-occupying mass of the liver. No biliary dilatation. Pancreas: Peripancreatic inflammatory changes are noted. No evidence of pancreatic necrosis or ductal dilatation. No enhancing mass lesions. Fluid along the right lateral conal fascia is seen. Spleen: Punctate calcifications/granulomas within the normal size spleen. Adrenals/Urinary Tract: Unremarkable bilateral adrenal glands and kidneys apart from what may represent a 5 mm cyst in lower pole the right kidney. No obstructive uropathy. The urinary bladder is unremarkable for degree of distention. Stomach/Bowel: Decompressed stomach. No bowel obstruction or free air. Inflammatory changes in the right upper quadrant are noted adjacent to the second and third portion of the duodenum likely the patient's pancreatic inflammation although the possibility of peptic ulcer disease could have a similar appearance. Scattered colonic diverticulosis without acute diverticulitis more so along the left colon. Vascular/Lymphatic: Aortic atherosclerosis. No aneurysm. No enlarged abdominal or pelvic lymph nodes. Reproductive: Intramural and subserosal uterine fibroids are redemonstrated. Two are noted on the right measuring between 14 and 26 mm and one on the left measuring 38 mm. No adnexal mass. Other: Tiny fat containing umbilical hernia.  No free air. Musculoskeletal: L3 vertebral body hemangioma. Mild disc space narrowing L3-4. IMPRESSION: 1. Peripancreatic inflammation  consistent with acute uncomplicated pancreatitis given the patient's elevated lipase. 2. Uncomplicated cholelithiasis.  No choledocholithiasis. 3. Stable fibroid uterus. Electronically Signed   By: Tollie Ethavid  Kwon M.D.   On: 05/17/2017 18:47    Medications:   . enoxaparin (LOVENOX) injection  40 mg Subcutaneous Daily   Continuous Infusions: . sodium chloride    . famotidine (PEPCID) IV 20 mg (05/18/17 0841)     LOS: 1 day   Hillery Aldohristina Kiyara Bouffard  [facility] Pager [phone]) [phone]. If unable to reach me by pager, please call my cell phone at [phone].  *Please refer to [email or web address], password [email or web address] to get updated schedule on who will round on this patient, as hospitalists switch teams weekly. If 7PM-7AM, please contact night-coverage at [email or web address], password [email or web address] for any overnight needs.  05/18/2017, 9:17 AM

## 2017-05-18 NOTE — Progress Notes (Signed)
Subjective/Chief Complaint: Pain better, no n/v   Objective: Vital signs in last 24 hours: Temp:  [98.1 F (36.7 C)-98.4 F (36.9 C)] 98.2 F (36.8 C) (04/01 0755) Pulse Rate:  [59-81] 73 (04/01 0755) Resp:  [18] 18 (04/01 0755) BP: (108-152)/(67-90) 132/76 (04/01 0755) SpO2:  [91 %-100 %] 98 % (04/01 0755) Weight:  [99.9 kg (220 lb 3.8 oz)] 99.9 kg (220 lb 3.8 oz) (04/01 0755)    Intake/Output from previous day: 03/31 0701 - 04/01 0700 In: 2593.4 [IV Piggyback:2593.4] Out: -  Intake/Output this shift: No intake/output data recorded.  GI: soft mild tender epigastrium  Lab Results:  Recent Labs    05/17/17 1357 05/18/17 0146  WBC 9.2 8.6  HGB 13.5 11.9*  HCT 42.1 36.5  PLT 191 169   BMET Recent Labs    05/17/17 1357 05/18/17 0146  NA 140 140  K 3.4* 3.3*  CL 105 108  CO2 25 24  GLUCOSE 101* 93  BUN 9 9  CREATININE 0.85 0.82  CALCIUM 9.4 8.3*   PT/INR No results for input(s): LABPROT, INR in the last 72 hours. ABG No results for input(s): PHART, HCO3 in the last 72 hours.  Invalid input(s): PCO2, PO2  Studies/Results: Ct Abdomen Pelvis W Contrast  Result Date: 05/17/2017 CLINICAL DATA:  Epigastric and left lower quadrant pain with vomiting. Elevated lipase. EXAM: CT ABDOMEN AND PELVIS WITH CONTRAST TECHNIQUE: Multidetector CT imaging of the abdomen and pelvis was performed using the standard protocol following bolus administration of intravenous contrast. CONTRAST:  100mL ISOVUE-300 IOPAMIDOL (ISOVUE-300) INJECTION 61% COMPARISON:  09/23/2015 FINDINGS: Lower chest: No acute abnormality. Hepatobiliary: Tiny dependent gallstones are noted without secondary signs of cholecystitis. No space-occupying mass of the liver. No biliary dilatation. Pancreas: Peripancreatic inflammatory changes are noted. No evidence of pancreatic necrosis or ductal dilatation. No enhancing mass lesions. Fluid along the right lateral conal fascia is seen. Spleen: Punctate  calcifications/granulomas within the normal size spleen. Adrenals/Urinary Tract: Unremarkable bilateral adrenal glands and kidneys apart from what may represent a 5 mm cyst in lower pole the right kidney. No obstructive uropathy. The urinary bladder is unremarkable for degree of distention. Stomach/Bowel: Decompressed stomach. No bowel obstruction or free air. Inflammatory changes in the right upper quadrant are noted adjacent to the second and third portion of the duodenum likely the patient's pancreatic inflammation although the possibility of peptic ulcer disease could have a similar appearance. Scattered colonic diverticulosis without acute diverticulitis more so along the left colon. Vascular/Lymphatic: Aortic atherosclerosis. No aneurysm. No enlarged abdominal or pelvic lymph nodes. Reproductive: Intramural and subserosal uterine fibroids are redemonstrated. Two are noted on the right measuring between 14 and 26 mm and one on the left measuring 38 mm. No adnexal mass. Other: Tiny fat containing umbilical hernia.  No free air. Musculoskeletal: L3 vertebral body hemangioma. Mild disc space narrowing L3-4. IMPRESSION: 1. Peripancreatic inflammation consistent with acute uncomplicated pancreatitis given the patient's elevated lipase. 2. Uncomplicated cholelithiasis.  No choledocholithiasis. 3. Stable fibroid uterus. Electronically Signed   By: Tollie Ethavid  Kwon M.D.   On: 05/17/2017 18:47    Anti-infectives: Anti-infectives (From admission, onward)   None      Assessment/Plan: GSP, resolving  Will check us today to confirm ct findings Likely or tomorrow pending clinical course I discussed the procedure in detail. We discussed the risks and benefits of a laparoscopic cholecystectomy and possible cholangiogram including, but not limited to bleeding, infection, injury to surrounding structures such as the intestine or liver, bile leak,  retained gallstones, need to convert to an open procedure, prolonged  diarrhea, blood clots such as  DVT, common bile duct injury, anesthesia risks, and possible need for additional procedures.  The likelihood of improvement in symptoms and return to the patient's normal status is good. We discussed the typical post-operative recovery course.  Hannah Burgess 05/18/2017

## 2017-05-18 NOTE — ED Notes (Signed)
Pt NPO. No breakfast tray ordered.  

## 2017-05-19 ENCOUNTER — Encounter (HOSPITAL_COMMUNITY)
Admission: EM | Disposition: A | Discharge status: Home / Self Care | Payer: Self-pay | Source: Home / Self Care | Attending: Internal Medicine

## 2017-05-19 ENCOUNTER — Inpatient Hospital Stay (HOSPITAL_COMMUNITY): Payer: 59 | Admitting: Anesthesiology

## 2017-05-19 ENCOUNTER — Inpatient Hospital Stay (HOSPITAL_COMMUNITY): Payer: 59

## 2017-05-19 HISTORY — PX: CHOLECYSTECTOMY: SHX55

## 2017-05-19 LAB — COMPREHENSIVE METABOLIC PANEL
ALT: 59 U/L — ABNORMAL HIGH (ref 14–54)
AST: 42 U/L — AB (ref 15–41)
Albumin: 3.2 g/dL — ABNORMAL LOW (ref 3.5–5.0)
Alkaline Phosphatase: 146 U/L — ABNORMAL HIGH (ref 38–126)
Anion gap: 10 (ref 5–15)
BUN: 5 mg/dL — ABNORMAL LOW (ref 6–20)
CHLORIDE: 106 mmol/L (ref 101–111)
CO2: 24 mmol/L (ref 22–32)
Calcium: 8.6 mg/dL — ABNORMAL LOW (ref 8.9–10.3)
Creatinine, Ser: 0.85 mg/dL (ref 0.44–1.00)
GFR calc non Af Amer: >60 mL/min (ref >60)
Glucose, Bld: 95 mg/dL (ref 65–99)
POTASSIUM: 3.5 mmol/L (ref 3.5–5.1)
SODIUM: 140 mmol/L (ref 135–145)
Total Bilirubin: 1.2 mg/dL (ref 0.3–1.2)
Total Protein: 6.3 g/dL — ABNORMAL LOW (ref 6.5–8.1)

## 2017-05-19 LAB — SURGICAL PCR SCREEN
MRSA, PCR: NEGATIVE
Staphylococcus aureus: NEGATIVE

## 2017-05-19 LAB — LIPASE, BLOOD: LIPASE: 41 U/L (ref 11–51)

## 2017-05-19 LAB — GLUCOSE, CAPILLARY: GLUCOSE-CAPILLARY: 81 mg/dL (ref 65–99)

## 2017-05-19 SURGERY — LAPAROSCOPIC CHOLECYSTECTOMY WITH INTRAOPERATIVE CHOLANGIOGRAM
Anesthesia: General | Site: Abdomen

## 2017-05-19 MED ORDER — AMLODIPINE BESYLATE 5 MG PO TABS
5.0000 mg | ORAL_TABLET | Freq: Every day | ORAL | Status: DC
  Administered 2017-05-20: 5 mg via ORAL
  Filled 2017-05-19: qty 1

## 2017-05-19 MED ORDER — SUCCINYLCHOLINE CHLORIDE 200 MG/10ML IV SOSY
PREFILLED_SYRINGE | INTRAVENOUS | Status: AC
  Filled 2017-05-19: qty 10

## 2017-05-19 MED ORDER — POTASSIUM CHLORIDE IN NACL 20-0.9 MEQ/L-% IV SOLN
INTRAVENOUS | Status: DC
  Administered 2017-05-19 – 2017-05-20 (×2): via INTRAVENOUS
  Filled 2017-05-19 (×2): qty 1000

## 2017-05-19 MED ORDER — DEXAMETHASONE SODIUM PHOSPHATE 10 MG/ML IJ SOLN
INTRAMUSCULAR | Status: DC | PRN
  Administered 2017-05-19: 10 mg via INTRAVENOUS

## 2017-05-19 MED ORDER — DEXAMETHASONE SODIUM PHOSPHATE 10 MG/ML IJ SOLN
INTRAMUSCULAR | Status: AC
  Filled 2017-05-19: qty 1

## 2017-05-19 MED ORDER — 0.9 % SODIUM CHLORIDE (POUR BTL) OPTIME
TOPICAL | Status: DC | PRN
  Administered 2017-05-19: 1000 mL

## 2017-05-19 MED ORDER — MIDAZOLAM HCL 2 MG/2ML IJ SOLN
INTRAMUSCULAR | Status: AC
  Filled 2017-05-19: qty 2

## 2017-05-19 MED ORDER — LIDOCAINE 2% (20 MG/ML) 5 ML SYRINGE
INTRAMUSCULAR | Status: DC | PRN
  Administered 2017-05-19: 60 mg via INTRAVENOUS

## 2017-05-19 MED ORDER — CIPROFLOXACIN IN D5W 400 MG/200ML IV SOLN
400.0000 mg | Freq: Once | INTRAVENOUS | Status: AC
  Administered 2017-05-19: 400 mg via INTRAVENOUS
  Filled 2017-05-19 (×2): qty 200

## 2017-05-19 MED ORDER — FENTANYL CITRATE (PF) 250 MCG/5ML IJ SOLN
INTRAMUSCULAR | Status: DC | PRN
  Administered 2017-05-19 (×2): 50 ug via INTRAVENOUS
  Administered 2017-05-19: 100 ug via INTRAVENOUS
  Administered 2017-05-19: 50 ug via INTRAVENOUS

## 2017-05-19 MED ORDER — OXYCODONE HCL 5 MG/5ML PO SOLN: 5.0000 mg | Freq: Once | ORAL | Status: DC | PRN

## 2017-05-19 MED ORDER — FENTANYL CITRATE (PF) 250 MCG/5ML IJ SOLN
INTRAMUSCULAR | Status: AC
  Filled 2017-05-19: qty 5

## 2017-05-19 MED ORDER — PROPOFOL 10 MG/ML IV BOLUS
INTRAVENOUS | Status: DC | PRN
  Administered 2017-05-19: 160 mg via INTRAVENOUS

## 2017-05-19 MED ORDER — STERILE WATER FOR IRRIGATION IR SOLN
Status: DC | PRN
  Administered 2017-05-19: 1000 mL

## 2017-05-19 MED ORDER — OXYCODONE HCL 5 MG PO TABS: 5.0000 mg | ORAL_TABLET | Freq: Once | ORAL | Status: DC | PRN

## 2017-05-19 MED ORDER — SODIUM CHLORIDE 0.9 % IR SOLN
Status: DC | PRN
  Administered 2017-05-19: 1000 mL

## 2017-05-19 MED ORDER — ONDANSETRON HCL 4 MG/2ML IJ SOLN: 4.0000 mg | Freq: Once | INTRAMUSCULAR | Status: DC | PRN

## 2017-05-19 MED ORDER — BUPIVACAINE-EPINEPHRINE 0.25% -1:200000 IJ SOLN
INTRAMUSCULAR | Status: DC | PRN
  Administered 2017-05-19: 15 mL

## 2017-05-19 MED ORDER — METHOCARBAMOL 500 MG PO TABS: 500.0000 mg | ORAL_TABLET | Freq: Three times a day (TID) | ORAL | Status: DC | PRN

## 2017-05-19 MED ORDER — ONDANSETRON HCL 4 MG/2ML IJ SOLN
INTRAMUSCULAR | Status: AC
  Filled 2017-05-19: qty 2

## 2017-05-19 MED ORDER — ONDANSETRON HCL 4 MG/2ML IJ SOLN
INTRAMUSCULAR | Status: DC | PRN
  Administered 2017-05-19: 4 mg via INTRAVENOUS

## 2017-05-19 MED ORDER — ROCURONIUM BROMIDE 10 MG/ML (PF) SYRINGE
PREFILLED_SYRINGE | INTRAVENOUS | Status: DC | PRN
  Administered 2017-05-19: 50 mg via INTRAVENOUS

## 2017-05-19 MED ORDER — IOPAMIDOL (ISOVUE-300) INJECTION 61%
INTRAVENOUS | Status: AC
  Filled 2017-05-19: qty 50

## 2017-05-19 MED ORDER — ROCURONIUM BROMIDE 10 MG/ML (PF) SYRINGE
PREFILLED_SYRINGE | INTRAVENOUS | Status: AC
  Filled 2017-05-19: qty 5

## 2017-05-19 MED ORDER — OXYCODONE HCL 5 MG PO TABS
5.0000 mg | ORAL_TABLET | ORAL | Status: DC | PRN
  Administered 2017-05-19 – 2017-05-20 (×2): 5 mg via ORAL
  Filled 2017-05-19 (×2): qty 1

## 2017-05-19 MED ORDER — MIDAZOLAM HCL 5 MG/5ML IJ SOLN
INTRAMUSCULAR | Status: DC | PRN
  Administered 2017-05-19: 2 mg via INTRAVENOUS

## 2017-05-19 MED ORDER — SUGAMMADEX SODIUM 200 MG/2ML IV SOLN
INTRAVENOUS | Status: DC | PRN
  Administered 2017-05-19: 200 mg via INTRAVENOUS

## 2017-05-19 MED ORDER — HEMOSTATIC AGENTS (NO CHARGE) OPTIME
TOPICAL | Status: DC | PRN
  Administered 2017-05-19: 1

## 2017-05-19 MED ORDER — LIDOCAINE HCL (CARDIAC) 20 MG/ML IV SOLN
INTRAVENOUS | Status: AC
  Filled 2017-05-19: qty 5

## 2017-05-19 MED ORDER — BUPIVACAINE-EPINEPHRINE (PF) 0.25% -1:200000 IJ SOLN
INTRAMUSCULAR | Status: AC
  Filled 2017-05-19: qty 30

## 2017-05-19 MED ORDER — HYDROMORPHONE HCL 1 MG/ML IJ SOLN
0.5000 mg | INTRAMUSCULAR | Status: DC | PRN
  Administered 2017-05-19: 0.5 mg via INTRAVENOUS
  Filled 2017-05-19: qty 1

## 2017-05-19 MED ORDER — LACTATED RINGERS IV SOLN
INTRAVENOUS | Status: DC
  Administered 2017-05-19 (×2): via INTRAVENOUS

## 2017-05-19 MED ORDER — SODIUM CHLORIDE 0.9 % IV SOLN
INTRAVENOUS | Status: DC | PRN
  Administered 2017-05-19: 7 mL

## 2017-05-19 MED ORDER — FENTANYL CITRATE (PF) 100 MCG/2ML IJ SOLN: 25.0000 ug | INTRAMUSCULAR | Status: DC | PRN

## 2017-05-19 SURGICAL SUPPLY — 50 items
APPLIER CLIP 5 13 M/L LIGAMAX5 (MISCELLANEOUS) ×3
CANISTER SUCT 3000ML PPV (MISCELLANEOUS) ×3 IMPLANT
CHLORAPREP W/TINT 26ML (MISCELLANEOUS) ×3 IMPLANT
CLIP APPLIE 5 13 M/L LIGAMAX5 (MISCELLANEOUS) ×1 IMPLANT
CLOSURE WOUND 1/2 X4 (GAUZE/BANDAGES/DRESSINGS) ×1
COVER MAYO STAND STRL (DRAPES) ×3 IMPLANT
COVER SURGICAL LIGHT HANDLE (MISCELLANEOUS) ×3 IMPLANT
DERMABOND ADVANCED (GAUZE/BANDAGES/DRESSINGS) ×2
DERMABOND ADVANCED .7 DNX12 (GAUZE/BANDAGES/DRESSINGS) ×1 IMPLANT
DEVICE TROCAR PUNCTURE CLOSURE (ENDOMECHANICALS) ×3 IMPLANT
DRAPE C-ARM 42X72 X-RAY (DRAPES) ×3 IMPLANT
ELECT REM PT RETURN 9FT ADLT (ELECTROSURGICAL) ×3
ELECTRODE REM PT RTRN 9FT ADLT (ELECTROSURGICAL) ×1 IMPLANT
GLOVE BIO SURGEON STRL SZ 6.5 (GLOVE) ×2 IMPLANT
GLOVE BIO SURGEON STRL SZ7 (GLOVE) ×3 IMPLANT
GLOVE BIO SURGEONS STRL SZ 6.5 (GLOVE) ×1
GLOVE BIOGEL PI IND STRL 6.5 (GLOVE) ×1 IMPLANT
GLOVE BIOGEL PI IND STRL 7.0 (GLOVE) ×1 IMPLANT
GLOVE BIOGEL PI IND STRL 7.5 (GLOVE) ×1 IMPLANT
GLOVE BIOGEL PI INDICATOR 6.5 (GLOVE) ×2
GLOVE BIOGEL PI INDICATOR 7.0 (GLOVE) ×2
GLOVE BIOGEL PI INDICATOR 7.5 (GLOVE) ×2
GLOVE ECLIPSE 6.5 STRL STRAW (GLOVE) ×3 IMPLANT
GLOVE ECLIPSE 8.0 STRL XLNG CF (GLOVE) ×3 IMPLANT
GLOVE SURG SS PI 6.5 STRL IVOR (GLOVE) ×3 IMPLANT
GOWN STRL REUS W/ TWL LRG LVL3 (GOWN DISPOSABLE) ×3 IMPLANT
GOWN STRL REUS W/ TWL XL LVL3 (GOWN DISPOSABLE) ×1 IMPLANT
GOWN STRL REUS W/TWL LRG LVL3 (GOWN DISPOSABLE) ×6
GOWN STRL REUS W/TWL XL LVL3 (GOWN DISPOSABLE) ×2
HEMOSTAT SNOW SURGICEL 2X4 (HEMOSTASIS) ×3 IMPLANT
KIT BASIN OR (CUSTOM PROCEDURE TRAY) ×3 IMPLANT
KIT TURNOVER KIT B (KITS) ×3 IMPLANT
NS IRRIG 1000ML POUR BTL (IV SOLUTION) ×3 IMPLANT
PAD ARMBOARD 7.5X6 YLW CONV (MISCELLANEOUS) ×3 IMPLANT
POUCH RETRIEVAL ECOSAC 10 (ENDOMECHANICALS) ×1 IMPLANT
POUCH RETRIEVAL ECOSAC 10MM (ENDOMECHANICALS) ×2
SCISSORS LAP 5X35 DISP (ENDOMECHANICALS) ×3 IMPLANT
SET CHOLANGIOGRAPH 5 50 .035 (SET/KITS/TRAYS/PACK) ×3 IMPLANT
SET IRRIG TUBING LAPAROSCOPIC (IRRIGATION / IRRIGATOR) ×3 IMPLANT
SLEEVE ENDOPATH XCEL 5M (ENDOMECHANICALS) ×6 IMPLANT
SPECIMEN JAR SMALL (MISCELLANEOUS) ×3 IMPLANT
STRIP CLOSURE SKIN 1/2X4 (GAUZE/BANDAGES/DRESSINGS) ×2 IMPLANT
SUT MNCRL AB 4-0 PS2 18 (SUTURE) ×3 IMPLANT
SUT VICRYL 0 UR6 27IN ABS (SUTURE) ×3 IMPLANT
TOWEL GREEN STERILE (TOWEL DISPOSABLE) ×3 IMPLANT
TRAY LAPAROSCOPIC MC (CUSTOM PROCEDURE TRAY) ×3 IMPLANT
TROCAR XCEL BLUNT TIP 100MML (ENDOMECHANICALS) ×3 IMPLANT
TROCAR XCEL NON-BLD 5MMX100MML (ENDOMECHANICALS) ×3 IMPLANT
TUBING INSUFFLATION (TUBING) ×3 IMPLANT
WATER STERILE IRR 1000ML POUR (IV SOLUTION) ×3 IMPLANT

## 2017-05-19 NOTE — Op Note (Addendum)
Preoperative diagnosis:gallstone pancreatitis Postoperative diagnosis:same as above, chronic cholecystitis Procedure: Laparoscopic cholecystectomy with cholangiogram Surgeon: Dr. Harden MoMatt Hermila Millis Asst: Evangeline GulaBrooke Miller, PA-C Anesthesia: Gen. Specimens: gb to pathology Estimated blood loss:minimal Complications: None Drains: none Sponge count was correct at completion Disposition to recovery stable  Indications: This is a62 yof who has gallstones and ultrasound and has resolved pancreatitis. We discussed going to the or for lap chole with cholangiogram.  Procedure: After informed consent was obtained the patient was taken to the operating room.She was given antibiotics. SCDs were in place.She was placed undergeneral anesthesia without complication. Herabdomen was prepped and draped in the standard sterile surgical fashion. A surgical timeout was then performed.  I infiltrated marcainebelow the umbilicus.  I then incised the fascia.  I placed a 0 vicryl pursestring suture. I then inserted a hasson trocar and I insufflated the abdomen to 15 mm Hg pressure. There was no entry injury.   An additional ruq, right mid abdomen and epigastric trocar were both placed under direct vision (5 mm).  The gallbladder had evidence of chronic cholecystitis.I grasped the gallbladder and retracted it cephalad and lateral. EventuallyI was able to identify the critical view of safety.I then clipped the duct distally. I then made a ductotomy. I inserted a cholangiocatheter.  I then clipped this in place.  I then did a cholangiogram. The cystic duct had the catheter, the liver filled and there was no evidence of stones. The duodenum filled. I then removed the cholangiocatheter. The duct was viable. I clipped the duct proximally. The clips traversed the duct. I then clipped and divided the cystic artery.I then removed the gallbladder from the liver bed. I placed the gallbladderin a bag and removed from the  umbilicus. I obtained hemostasis. I then placed a 0 vicryl pursestring suture and inserted a hasson trocar. I placed a piece of snow in the liver bed. I then removed the hasson trocar, tied down the pursestringand closed this with multiple2-0 vicrylsusing the endoclose device to close the trocar site.I then removed the remaining trocars and these were closed with 4-0 Monocryl and glue. She tolerated this well be transferred to the recovery room.

## 2017-05-19 NOTE — Discharge Instructions (Signed)
[facility] ______[facility] °LAPAROSCOPIC SURGERY: POST OP INSTRUCTIONS °Always review your discharge instruction sheet given to you by the facility where your surgery was performed. °IF YOU HAVE DISABILITY OR FAMILY LEAVE FORMS, YOU MUST BRING THEM TO THE OFFICE FOR PROCESSING.   °DO NOT GIVE THEM TO YOUR DOCTOR. ° °1. A prescription for pain medication may be given to you upon discharge.  Take your pain medication as prescribed, if needed.  If narcotic pain medicine is not needed, then you may take acetaminophen (Tylenol) or ibuprofen (Advil) as needed. °2. Take your usually prescribed medications unless otherwise directed. °3. If you need a refill on your pain medication, please contact your pharmacy.  They will contact our office to request authorization. Prescriptions will not be filled after 5pm or on week-ends. °4. You should follow a light diet the first few days after arrival home, such as soup and crackers, etc.  Be sure to include lots of fluids daily. °5. Most patients will experience some swelling and bruising in the area of the incisions.  Ice packs will help.  Swelling and bruising can take several days to resolve.  °6. It is common to experience some constipation if taking pain medication after surgery.  Increasing fluid intake and taking a stool softener (such as Colace) will usually help or prevent this problem from occurring.  A mild laxative (Milk of Magnesia or Miralax) should be taken according to package instructions if there are no bowel movements after 48 hours. °7. Unless discharge instructions indicate otherwise, you may remove your bandages 24-48 hours after surgery, and you may shower at that time.  You may have steri-strips (small skin tapes) in place directly over the incision.  These strips should be left on the skin for 7-10 days.  If your surgeon used skin glue on the incision, you may shower in 24 hours.  The glue will flake off over the next 2-3 weeks.  Any sutures or  staples will be removed at the office during your follow-up visit. °8. ACTIVITIES:  You may resume regular (light) daily activities beginning the next day--such as daily self-care, walking, climbing stairs--gradually increasing activities as tolerated.  You may have sexual intercourse when it is comfortable.  Refrain from any heavy lifting or straining until approved by your doctor. °a. You may drive when you are no longer taking prescription pain medication, you can comfortably wear a seatbelt, and you can safely maneuver your car and apply brakes. °b. RETURN TO WORK:  __________________________________________________________ °9. You should see your doctor in the office for a follow-up appointment approximately 2-3 weeks after your surgery.  Make sure that you call for this appointment within a day or two after you arrive home to insure a convenient appointment time. °10. OTHER INSTRUCTIONS: __________________________________________________________________________________________________________________________ __________________________________________________________________________________________________________________________ °WHEN TO CALL YOUR DOCTOR: °1. Fever over 101.0 °2. Inability to urinate °3. Continued bleeding from incision. °4. Increased pain, redness, or drainage from the incision. °5. Increasing abdominal pain ° °The clinic staff is available to answer your questions during regular business hours.  Please don’t hesitate to call and ask to speak to one of the nurses for clinical concerns.  If you have a medical emergency, go to the nearest emergency room or call 911.  A surgeon from [facility] is always on call at the hospital. °[address], [address], [place], [place]  [address] ? [address] [address], , [place]   [address] °[phone] ? [phone] ? FAX [phone] °Web site:   [email or web address] °

## 2017-05-19 NOTE — Progress Notes (Signed)
Patient Demographics:    Hannah Burgess, is a 62 y.o. female, DOB - 1955/03/12, MWU:[id number]  Admit date - 05/17/2017   Admitting Physician Briscoe Deutscherimothy S Opyd, MD  Outpatient Primary MD for the patient is [name], [place], MD  LOS - 2   Chief Complaint  Patient presents with  . Abdominal Pain  . Emesis        Subjective:    Hannah Burgess Joss today has no fevers, no emesis,  No chest pain, feeling  much better postop  Assessment  & Plan :    Principal Problem:   Acute pancreatitis Active Problems:   Hypertension   Elevated LFTs   Cholelithiasis   Acute biliary pancreatitis  Brief Summary  Hannah Burgess Hannah Burgess is an 62 y.o. female with a PMH of hypertension who was admitted 05/17/17 with abdominal pain nausea vomiting and found to have biliary pancreatitis.  On admission her lipase was noted to be 1282 and a CT of the abdomen/pelvis revealed peripancreatic inflammation consistent with acute pancreatitis as well as cholelithiasis without acute cholecystitis or choledocholithiasis.  She underwent lap chole with intraoperative cholangiogram on 05/19/2017.    Plan; 1)Acute gallstone pancreatitis/elevated LFTs/cholelithiasis- clinically much improved, status post lap chole with intraoperative cholangiogram on 05/19/2017, further management per surgical team   2)HTN-stable, restart Norvasc 5 mg daily, Use Novolog/Humalog Sliding scale insulin with Accu-Cheks/Fingersticks as ordered   Code Status : Full    Disposition Plan  : Home 05/20/17  Consults  : General surgery  DVT Prophylaxis  :  Lovenox  Lab Results  Component Value Date   PLT 169 05/18/2017    Inpatient Medications  Scheduled Meds: . enoxaparin (LOVENOX) injection  40 mg Subcutaneous Daily   Continuous Infusions: . 0.9 % NaCl with KCl 20 mEq / L 75 mL/hr at 05/19/17 1350  . famotidine (PEPCID) IV 20 mg (05/19/17 0906)   PRN  Meds:.acetaminophen **OR** acetaminophen, hydrALAZINE, HYDROmorphone (DILAUDID) injection, methocarbamol, ondansetron **OR** ondansetron (ZOFRAN) IV, oxyCODONE   Anti-infectives (From admission, onward)   Start     Dose/Rate Route Frequency Ordered Stop   05/19/17 0900  ciprofloxacin (CIPRO) IVPB 400 mg     400 mg 200 mL/hr over 60 Minutes Intravenous  Once 05/19/17 0806 05/19/17 1116        Objective:   Vitals:   05/19/17 1300 05/19/17 1315 05/19/17 1337 05/19/17 1716  BP: 128/65 126/65 130/66 137/77  Pulse: 63 62 66 71  Resp: 13 15 16 16   Temp: 97.8 F (36.6 C)  98.8 F (37.1 C) 99 F (37.2 C)  TempSrc:   Oral Oral  SpO2: 97% 97% 99% 99%  Weight:      Height:        Wt Readings from Last 3 Encounters:  05/19/17 102.3 kg (225 lb 8.5 oz)  02/23/13 93.4 kg (206 lb)    Intake/Output Summary (Last 24 hours) at 05/19/2017 1901 Last data filed at 05/19/2017 1800 Gross per 24 hour  Intake 3142.5 ml  Output 500 ml  Net 2642.5 ml     Physical Exam  Gen:- Awake Alert, no acute distress HEENT:- [place].AT, No sclera icterus Neck-Supple Neck,No JVD,.  Lungs-  CTAB , good air movement CV- S1, S2 normal Abd-  +ve B.Sounds, Abd Soft, appropriate  postop area tenderness,    Extremity/Skin:- No  edema,   Good pulses Psych-affect is appropriate, oriented x3 Neuro-no new focal deficits, no tremors   Data Review:   Micro Results Recent Results (from the past 240 hour(s))  Surgical pcr screen     Status: None   Collection Time: 05/18/17 10:15 PM  Result Value Ref Range Status   MRSA, PCR NEGATIVE NEGATIVE Final   Staphylococcus aureus NEGATIVE NEGATIVE Final    Comment: (NOTE) The Xpert SA Assay (FDA approved for NASAL specimens in patients 51 years of age and older), is one component of a comprehensive surveillance program. It is not intended to diagnose infection nor to guide or monitor treatment. Performed at [facility], [address], [place], [place] [address]       Radiology Reports Dg Cholangiogram Operative  Result Date: 05/19/2017 CLINICAL DATA:  Intraoperative cholangiogram during laparoscopic cholecystectomy. EXAM: INTRAOPERATIVE CHOLANGIOGRAM FLUOROSCOPY TIME:  9 seconds COMPARISON:  Right upper quadrant abdominal ultrasound-05/18/2017; CT abdomen and pelvis - 05/17/2017 FINDINGS: Intraoperative cholangiographic images of the right upper abdominal quadrant during laparoscopic cholecystectomy are provided for review. Surgical clips overlie the expected location of the gallbladder fossa. Contrast injection demonstrates selective cannulation of the central aspect of the cystic duct. There is passage of contrast through the central aspect of the cystic duct with filling of a non dilated common bile duct. There is passage of contrast though the CBD and into the descending portion of the duodenum. There is minimal reflux of injected contrast into the common hepatic duct and central aspect of the non dilated intrahepatic biliary system. There are no discrete filling defects within the opacified portions of the biliary system to suggest the presence of choledocholithiasis. IMPRESSION: No evidence of choledocholithiasis. Electronically Signed   By: Simonne Come M.D.   On: 05/19/2017 14:09   Ct Abdomen Pelvis W Contrast  Result Date: 05/17/2017 CLINICAL DATA:  Epigastric and left lower quadrant pain with vomiting. Elevated lipase. EXAM: CT ABDOMEN AND PELVIS WITH CONTRAST TECHNIQUE: Multidetector CT imaging of the abdomen and pelvis was performed using the standard protocol following bolus administration of intravenous contrast. CONTRAST:  ISOVUE-300 IOPAMIDOL (ISOVUE-300) INJECTION 61% COMPARISON:  09/23/2015 FINDINGS: Lower chest: No acute abnormality. Hepatobiliary: Tiny dependent gallstones are noted without secondary signs of cholecystitis. No space-occupying mass of the liver. No biliary dilatation. Pancreas: Peripancreatic inflammatory changes are noted. No  evidence of pancreatic necrosis or ductal dilatation. No enhancing mass lesions. Fluid along the right lateral conal fascia is seen. Spleen: Punctate calcifications/granulomas within the normal size spleen. Adrenals/Urinary Tract: Unremarkable bilateral adrenal glands and kidneys apart from what may represent a 5 mm cyst in lower pole the right kidney. No obstructive uropathy. The urinary bladder is unremarkable for degree of distention. Stomach/Bowel: Decompressed stomach. No bowel obstruction or free air. Inflammatory changes in the right upper quadrant are noted adjacent to the second and third portion of the duodenum likely the patient's pancreatic inflammation although the possibility of peptic ulcer disease could have a similar appearance. Scattered colonic diverticulosis without acute diverticulitis more so along the left colon. Vascular/Lymphatic: Aortic atherosclerosis. No aneurysm. No enlarged abdominal or pelvic lymph nodes. Reproductive: Intramural and subserosal uterine fibroids are redemonstrated. Two are noted on the right measuring between 14 and 26 mm and one on the left measuring 38 mm. No adnexal mass. Other: Tiny fat containing umbilical hernia.  No free air. Musculoskeletal: L3 vertebral body hemangioma. Mild disc space narrowing L3-4. IMPRESSION: 1. Peripancreatic inflammation  consistent with acute uncomplicated pancreatitis given the patient's elevated lipase. 2. Uncomplicated cholelithiasis.  No choledocholithiasis. 3. Stable fibroid uterus. Electronically Signed   By: Tollie Eth M.D.   On: 05/17/2017 18:47   US Abdomen Limited Ruq  Result Date: 05/18/2017 CLINICAL DATA:  Cholelithiasis EXAM: ULTRASOUND ABDOMEN LIMITED RIGHT UPPER QUADRANT COMPARISON:  CT abdomen and pelvis May 17, 2017 FINDINGS: Gallbladder: Gallbladder is contracted with multiple shadowing foci consistent with cholelithiasis. Largest gallstone measures 1.8 cm in length. Gallbladder wall does not appear appreciably  thickened, and there is no pericholecystic fluid. No sonographic Murphy sign noted by sonographer. Common bile duct: Diameter: 5 mm. No intrahepatic or extrahepatic biliary duct dilatation. Liver: No focal lesion identified. Within normal limits in parenchymal echogenicity. Portal vein is patent on color Doppler imaging with normal direction of blood flow towards the liver. IMPRESSION: Cholelithiasis. Gallbladder somewhat contracted without thickening of the wall. Study otherwise unremarkable. Electronically Signed   By: Bretta Bang III M.D.   On: 05/18/2017 10:48     CBC Recent Labs  Lab 05/17/17 1357 05/18/17 0146  WBC 9.2 8.6  HGB 13.5 11.9*  HCT 42.1 36.5  PLT 191 169  MCV 83.9 84.9  MCH 26.9 27.7  MCHC 32.1 32.6  RDW 13.7 13.9  LYMPHSABS  --  2.3  MONOABS  --  0.5  EOSABS  --  0.1  BASOSABS  --  0.0    Chemistries  Recent Labs  Lab 05/17/17 1357 05/18/17 0146 05/19/17 0453  NA 140 140 140  K 3.4* 3.3* 3.5  CL 105 108 106  CO2 25 24 24   GLUCOSE 101* 93 95  BUN 9 9 5*  CREATININE 0.85 0.82 0.85  CALCIUM 9.4 8.3* 8.6*  MG  --  1.9  --   AST 226* 78* 42*  ALT 142* 89* 59*  ALKPHOS 168* 144* 146*  BILITOT 1.3* 0.9 1.2   ------------------------------------------------------------------------------------------------------------------ No results for input(s): CHOL, HDL, LDLCALC, TRIG, CHOLHDL, LDLDIRECT in the last 72 hours.  No results found for: HGBA1C ------------------------------------------------------------------------------------------------------------------ No results for input(s): TSH, T4TOTAL, T3FREE, THYROIDAB in the last 72 hours.  Invalid input(s): FREET3 ------------------------------------------------------------------------------------------------------------------ No results for input(s): VITAMINB12, FOLATE, FERRITIN, TIBC, IRON, RETICCTPCT in the last 72 hours.  Coagulation profile No results for input(s): INR, PROTIME in the last 168  hours.  No results for input(s): DDIMER in the last 72 hours.  Cardiac Enzymes No results for input(s): CKMB, TROPONINI, MYOGLOBIN in the last 168 hours.  Invalid input(s): CK ------------------------------------------------------------------------------------------------------------------ No results found for: BNP   Shon Hale M.D on 05/19/2017 at 7:01 PM  Between 7am to 7pm - Pager - [phone]  After 7pm go to [email or web address] - password [email or web address]  [facility] -  Office  [phone]   Voice Recognition Reubin Milan dictation system was used to create this note, attempts have been made to correct errors. Please contact the author with questions and/or clarifications.

## 2017-05-19 NOTE — Anesthesia Procedure Notes (Signed)
Procedure Name: Intubation Date/Time: 05/19/2017 11:13 AM Performed by: Adria Dillonkin, Cristi Gwynn A, CRNA Pre-anesthesia Checklist: Patient identified, Emergency Drugs available, Suction available and Patient being monitored Patient Re-evaluated:Patient Re-evaluated prior to induction Oxygen Delivery Method: Circle system utilized Preoxygenation: Pre-oxygenation with 100% oxygen Induction Type: IV induction Ventilation: Mask ventilation without difficulty Laryngoscope Size: Miller and 2 Grade View: Grade I Tube type: Oral Tube size: 7.0 mm Number of attempts: 1 Airway Equipment and Method: Stylet Placement Confirmation: ETT inserted through vocal cords under direct vision,  positive ETCO2,  CO2 detector and breath sounds checked- equal and bilateral Secured at: 21 cm Tube secured with: Tape Dental Injury: Teeth and Oropharynx as per pre-operative assessment

## 2017-05-19 NOTE — Progress Notes (Signed)
Subjective/Chief Complaint: No pain, doing well   Objective: Vital signs in last 24 hours: Temp:  [97.9 F (36.6 C)-98 F (36.7 C)] 98 F (36.7 C) (04/02 0439) Pulse Rate:  [60-79] 69 (04/02 0439) Resp:  [18-19] 18 (04/02 0439) BP: (134-154)/(59-75) 134/75 (04/02 0439) SpO2:  [93 %-100 %] 93 % (04/02 0439) Weight:  [102.3 kg (225 lb 8.5 oz)] 102.3 kg (225 lb 8.5 oz) (04/02 0500) Last BM Date: 05/17/17  Intake/Output from previous day: 04/01 0701 - 04/02 0700 In: 1450 [I.V.:1450] Out: -  Intake/Output this shift: No intake/output data recorded.  GI: soft nontender  Lab Results:  Recent Labs    05/17/17 1357 05/18/17 0146  WBC 9.2 8.6  HGB 13.5 11.9*  HCT 42.1 36.5  PLT 191 169   BMET Recent Labs    05/18/17 0146 05/19/17 0453  NA 140 140  K 3.3* 3.5  CL 108 106  CO2 24 24  GLUCOSE 93 95  BUN 9 5*  CREATININE 0.82 0.85  CALCIUM 8.3* 8.6*   PT/INR No results for input(s): LABPROT, INR in the last 72 hours. ABG No results for input(s): PHART, HCO3 in the last 72 hours.  Invalid input(s): PCO2, PO2  Studies/Results: Ct Abdomen Pelvis W Contrast  Result Date: 05/17/2017 CLINICAL DATA:  Epigastric and left lower quadrant pain with vomiting. Elevated lipase. EXAM: CT ABDOMEN AND PELVIS WITH CONTRAST TECHNIQUE: Multidetector CT imaging of the abdomen and pelvis was performed using the standard protocol following bolus administration of intravenous contrast. CONTRAST:  ISOVUE-300 IOPAMIDOL (ISOVUE-300) INJECTION 61% COMPARISON:  09/23/2015 FINDINGS: Lower chest: No acute abnormality. Hepatobiliary: Tiny dependent gallstones are noted without secondary signs of cholecystitis. No space-occupying mass of the liver. No biliary dilatation. Pancreas: Peripancreatic inflammatory changes are noted. No evidence of pancreatic necrosis or ductal dilatation. No enhancing mass lesions. Fluid along the right lateral conal fascia is seen. Spleen: Punctate  calcifications/granulomas within the normal size spleen. Adrenals/Urinary Tract: Unremarkable bilateral adrenal glands and kidneys apart from what may represent a 5 mm cyst in lower pole the right kidney. No obstructive uropathy. The urinary bladder is unremarkable for degree of distention. Stomach/Bowel: Decompressed stomach. No bowel obstruction or free air. Inflammatory changes in the right upper quadrant are noted adjacent to the second and third portion of the duodenum likely the patient's pancreatic inflammation although the possibility of peptic ulcer disease could have a similar appearance. Scattered colonic diverticulosis without acute diverticulitis more so along the left colon. Vascular/Lymphatic: Aortic atherosclerosis. No aneurysm. No enlarged abdominal or pelvic lymph nodes. Reproductive: Intramural and subserosal uterine fibroids are redemonstrated. Two are noted on the right measuring between 14 and 26 mm and one on the left measuring 38 mm. No adnexal mass. Other: Tiny fat containing umbilical hernia.  No free air. Musculoskeletal: L3 vertebral body hemangioma. Mild disc space narrowing L3-4. IMPRESSION: 1. Peripancreatic inflammation consistent with acute uncomplicated pancreatitis given the patient's elevated lipase. 2. Uncomplicated cholelithiasis.  No choledocholithiasis. 3. Stable fibroid uterus. Electronically Signed   By: Tollie Eth M.D.   On: 05/17/2017 18:47   US Abdomen Limited Ruq  Result Date: 05/18/2017 CLINICAL DATA:  Cholelithiasis EXAM: ULTRASOUND ABDOMEN LIMITED RIGHT UPPER QUADRANT COMPARISON:  CT abdomen and pelvis May 17, 2017 FINDINGS: Gallbladder: Gallbladder is contracted with multiple shadowing foci consistent with cholelithiasis. Largest gallstone measures 1.8 cm in length. Gallbladder wall does not appear appreciably thickened, and there is no pericholecystic fluid. No sonographic Murphy sign noted by sonographer. Common bile duct: Diameter:  5 mm. No intrahepatic or  extrahepatic biliary duct dilatation. Liver: No focal lesion identified. Within normal limits in parenchymal echogenicity. Portal vein is patent on color Doppler imaging with normal direction of blood flow towards the liver. IMPRESSION: Cholelithiasis. Gallbladder somewhat contracted without thickening of the wall. Study otherwise unremarkable. Electronically Signed   By: Bretta BangWilliam  Woodruff III M.D.   On: 05/18/2017 10:48    Anti-infectives: Anti-infectives (From admission, onward)   Start     Dose/Rate Route Frequency Ordered Stop   05/19/17 0815  ciprofloxacin (CIPRO) IVPB 400 mg     400 mg 200 mL/hr over 60 Minutes Intravenous  Once 05/19/17 [id number]        Assessment/Plan: Resolved gsp  Lap chole today I discussed the procedure in detail.  We discussed the risks and benefits of a laparoscopic cholecystectomy and possible cholangiogram including, but not limited to bleeding, infection, injury to surrounding structures such as the intestine or liver, bile leak, retained gallstones, need to convert to an open procedure, prolonged diarrhea, blood clots such as  DVT, common bile duct injury, anesthesia risks, and possible need for additional procedures.  The likelihood of improvement in symptoms and return to the patient's normal status is good. We discussed the typical post-operative recovery course.   Emelia LoronMatthew Melynda Krzywicki 05/19/2017

## 2017-05-19 NOTE — Anesthesia Preprocedure Evaluation (Signed)
Anesthesia Evaluation  Patient identified by MRN, date of birth, ID band Patient awake    Reviewed: Allergy & Precautions, NPO status , Patient's Chart, lab work & pertinent test results  Airway Mallampati: II  TM Distance: >3 FB Neck ROM: Full    Dental  (+) Edentulous Upper, Partial Lower   Pulmonary former smoker,    breath sounds clear to auscultation       Cardiovascular hypertension,  Rhythm:Regular Rate:Normal     Neuro/Psych    GI/Hepatic   Endo/Other    Renal/GU      Musculoskeletal   Abdominal   Peds  Hematology   Anesthesia Other Findings   Reproductive/Obstetrics                             Anesthesia Physical Anesthesia Plan  ASA: III  Anesthesia Plan: General   Post-op Pain Management:    Induction: Intravenous  PONV Risk Score and Plan: Ondansetron and Dexamethasone  Airway Management Planned: Oral ETT  Additional Equipment:   Intra-op Plan:   Post-operative Plan: Extubation in OR  Informed Consent: I have reviewed the patients History and Physical, chart, labs and discussed the procedure including the risks, benefits and alternatives for the proposed anesthesia with the patient or authorized representative who has indicated his/her understanding and acceptance.   Dental advisory given  Plan Discussed with: CRNA and Anesthesiologist  Anesthesia Plan Comments:         Anesthesia Quick Evaluation

## 2017-05-19 NOTE — Anesthesia Postprocedure Evaluation (Signed)
Anesthesia Post Note  Patient: Hannah FreedSylvia Dickison  Procedure(s) Performed: LAPAROSCOPIC CHOLECYSTECTOMY WITH INTRAOPERATIVE CHOLANGIOGRAM (N/A Abdomen)     Patient location during evaluation: PACU Anesthesia Type: General Level of consciousness: awake and alert Pain management: pain level controlled Vital Signs Assessment: post-procedure vital signs reviewed and stable Respiratory status: spontaneous breathing, nonlabored ventilation, respiratory function stable and patient connected to nasal cannula oxygen Cardiovascular status: blood pressure returned to baseline and stable Postop Assessment: no apparent nausea or vomiting Anesthetic complications: no    Last Vitals:  Vitals:   05/19/17 1245 05/19/17 1300  BP: 131/62 128/65  Pulse: 62 63  Resp: 13 13  Temp:  36.6 C  SpO2: 95% 97%    Last Pain:  Vitals:   05/19/17 1300  TempSrc:   PainSc: 0-No pain                 Allysia Ingles COKER

## 2017-05-19 NOTE — Progress Notes (Signed)
1337 received pt from PACU. Pt A&O x4. 5 steri-strip dressings clean, dry, intact. Pt complaining of pain 7 out of 10

## 2017-05-19 NOTE — Transfer of Care (Signed)
Immediate Anesthesia Transfer of Care Note  Patient: Hannah Burgess  Procedure(s) Performed: LAPAROSCOPIC CHOLECYSTECTOMY WITH INTRAOPERATIVE CHOLANGIOGRAM (N/A Abdomen)  Patient Location: PACU  Anesthesia Type:General  Level of Consciousness: awake, alert , oriented and patient cooperative  Airway & Oxygen Therapy: Patient Spontanous Breathing and Patient connected to nasal cannula oxygen  Post-op Assessment: Report given to RN and Post -op Vital signs reviewed and stable  Post vital signs: Reviewed and stable  Last Vitals:  Vitals Value Taken Time  BP 131/69 05/19/2017 12:28 PM  Temp    Pulse 62 05/19/2017 12:29 PM  Resp 9 05/19/2017 12:29 PM  SpO2 100 % 05/19/2017 12:29 PM  Vitals shown include unvalidated device data.  Last Pain:  Vitals:   05/19/17 0900  TempSrc:   PainSc: 0-No pain         Complications: No apparent anesthesia complications

## 2017-05-20 ENCOUNTER — Encounter (HOSPITAL_COMMUNITY): Payer: Self-pay | Admitting: General Surgery

## 2017-05-20 LAB — CBC
HEMATOCRIT: 35.9 % — AB (ref 36.0–46.0)
HEMOGLOBIN: 11.7 g/dL — AB (ref 12.0–15.0)
MCH: 27.3 pg (ref 26.0–34.0)
MCHC: 32.6 g/dL (ref 30.0–36.0)
MCV: 83.9 fL (ref 78.0–100.0)
Platelets: 177 10*3/uL (ref 150–400)
RBC: 4.28 MIL/uL (ref 3.87–5.11)
RDW: 13.8 % (ref 11.5–15.5)
WBC: 12.2 10*3/uL — AB (ref 4.0–10.5)

## 2017-05-20 LAB — COMPREHENSIVE METABOLIC PANEL
ALT: 64 U/L — ABNORMAL HIGH (ref 14–54)
AST: 62 U/L — AB (ref 15–41)
Albumin: 3.2 g/dL — ABNORMAL LOW (ref 3.5–5.0)
Alkaline Phosphatase: 138 U/L — ABNORMAL HIGH (ref 38–126)
Anion gap: 12 (ref 5–15)
BILIRUBIN TOTAL: 1 mg/dL (ref 0.3–1.2)
BUN: 7 mg/dL (ref 6–20)
CHLORIDE: 106 mmol/L (ref 101–111)
CO2: 20 mmol/L — ABNORMAL LOW (ref 22–32)
Calcium: 8.8 mg/dL — ABNORMAL LOW (ref 8.9–10.3)
Creatinine, Ser: 0.84 mg/dL (ref 0.44–1.00)
Glucose, Bld: 120 mg/dL — ABNORMAL HIGH (ref 65–99)
POTASSIUM: 4 mmol/L (ref 3.5–5.1)
Sodium: 138 mmol/L (ref 135–145)
TOTAL PROTEIN: 6.3 g/dL — AB (ref 6.5–8.1)

## 2017-05-20 LAB — LIPASE, BLOOD: LIPASE: 26 U/L (ref 11–51)

## 2017-05-20 MED ORDER — FAMOTIDINE 20 MG PO TABS
20.0000 mg | ORAL_TABLET | Freq: Two times a day (BID) | ORAL | Status: DC
  Administered 2017-05-20: 20 mg via ORAL
  Filled 2017-05-20: qty 1

## 2017-05-20 MED ORDER — OXYCODONE HCL 5 MG PO TABS
5.0000 mg | ORAL_TABLET | Freq: Four times a day (QID) | ORAL | 0 refills | Status: AC | PRN
Start: 2017-05-20 — End: ?

## 2017-05-20 NOTE — Progress Notes (Signed)
[facility] Progress Note  1 Day Post-Op  Subjective: CC- sore Overall doing well. She is a little sore this morning after moving around and getting cleaned up. Tolerated dinner last night, has not eaten breakfast yet. Denies n/v. Passing flatus, no BM.  Objective: Vital signs in last 24 hours: Temp:  [97.8 F (36.6 C)-99 F (37.2 C)] 98 F (36.7 C) (04/03 0530) Pulse Rate:  [62-71] 69 (04/03 0530) Resp:  [10-18] 16 (04/03 0530) BP: (121-148)/(62-80) 135/75 (04/03 0530) SpO2:  [95 %-100 %] 98 % (04/03 0530) Weight:  [224 lb 13.9 oz (102 kg)] 224 lb 13.9 oz (102 kg) (04/03 0500) Last BM Date: 05/17/17  Intake/Output from previous day: 04/02 0701 - 04/03 0700 In: 3326.5 [P.O.:960; I.V.:2316.5; IV Piggyback:50] Out: 2175 [Urine:2125; Blood:50] Intake/Output this shift: No intake/output data recorded.  PE: Gen:  Alert, NAD, pleasant HEENT: EOM's intact, pupils equal and round Card:  RRR, no M/G/R heard Pulm:  effort normal Abd: Soft, ND, appropriately tender, +BS, multiple lap incisions cdi with steri strips in place Psych: A&Ox3   Lab Results:  Recent Labs    05/18/17 0146 05/20/17 0428  WBC 8.6 12.2*  HGB 11.9* 11.7*  HCT 36.5 35.9*  PLT 169 177   BMET Recent Labs    05/19/17 0453 05/20/17 0428  NA 140 138  K 3.5 4.0  CL 106 106  CO2 24 20*  GLUCOSE 95 120*  BUN 5* 7  CREATININE 0.85 0.84  CALCIUM 8.6* 8.8*   PT/INR No results for input(s): LABPROT, INR in the last 72 hours. CMP     Component Value Date/Time   NA 138 05/20/2017 0428   K 4.0 05/20/2017 0428   CL 106 05/20/2017 0428   CO2 20 (L) 05/20/2017 0428   GLUCOSE 120 (H) 05/20/2017 0428   BUN 7 05/20/2017 0428   CREATININE 0.84 05/20/2017 0428   CALCIUM 8.8 (L) 05/20/2017 0428   PROT 6.3 (L) 05/20/2017 0428   ALBUMIN 3.2 (L) 05/20/2017 0428   AST 62 (H) 05/20/2017 0428   ALT 64 (H) 05/20/2017 0428   ALKPHOS 138 (H) 05/20/2017 0428   BILITOT 1.0 05/20/2017 0428   GFRNONAA >60 05/20/2017 0428   GFRAA >60 05/20/2017 0428   Lipase     Component Value Date/Time   LIPASE 26 05/20/2017 0428       Studies/Results: Dg Cholangiogram Operative  Result Date: 05/19/2017 CLINICAL DATA:  Intraoperative cholangiogram during laparoscopic cholecystectomy. EXAM: INTRAOPERATIVE CHOLANGIOGRAM FLUOROSCOPY TIME:  9 seconds COMPARISON:  Right upper quadrant abdominal ultrasound-05/18/2017; CT abdomen and pelvis - 05/17/2017 FINDINGS: Intraoperative cholangiographic images of the right upper abdominal quadrant during laparoscopic cholecystectomy are provided for review. Surgical clips overlie the expected location of the gallbladder fossa. Contrast injection demonstrates selective cannulation of the central aspect of the cystic duct. There is passage of contrast through the central aspect of the cystic duct with filling of a non dilated common bile duct. There is passage of contrast though the CBD and into the descending portion of the duodenum. There is minimal reflux of injected contrast into the common hepatic duct and central aspect of the non dilated intrahepatic biliary system. There are no discrete filling defects within the opacified portions of the biliary system to suggest the presence of choledocholithiasis. IMPRESSION: No evidence of choledocholithiasis. Electronically Signed   By: Simonne ComeJohn  Watts M.D.   On: 05/19/2017 14:09   [place] Abdomen Limited Ruq  Result Date: 05/18/2017 CLINICAL DATA:  Cholelithiasis EXAM: ULTRASOUND ABDOMEN LIMITED RIGHT  UPPER QUADRANT COMPARISON:  CT abdomen and pelvis May 17, 2017 FINDINGS: Gallbladder: Gallbladder is contracted with multiple shadowing foci consistent with cholelithiasis. Largest gallstone measures 1.8 cm in length. Gallbladder wall does not appear appreciably thickened, and there is no pericholecystic fluid. No sonographic Murphy sign noted by sonographer. Common bile duct: Diameter: 5 mm. No intrahepatic or extrahepatic biliary  duct dilatation. Liver: No focal lesion identified. Within normal limits in parenchymal echogenicity. Portal vein is patent on color Doppler imaging with normal direction of blood flow towards the liver. IMPRESSION: Cholelithiasis. Gallbladder somewhat contracted without thickening of the wall. Study otherwise unremarkable. Electronically Signed   By: Bretta Bang III M.D.   On: 05/18/2017 10:48    Anti-infectives: Anti-infectives (From admission, onward)   Start     Dose/Rate Route Frequency Ordered Stop   05/19/17 0900  ciprofloxacin (CIPRO) IVPB 400 mg     400 mg 200 mL/hr over 60 Minutes Intravenous  Once 05/19/17 0806 05/19/17 1116       Assessment/Plan HTN  Gallstone pancreatitis S/p Laparoscopic cholecystectomy with cholangiogram 4/2 Dr. Dwain Sarna - POD 1 - IOC negative - bilirubin WNL today - tolerating diet, pain controlled on oral medications  ID - cipro perioperative FEN - decrease IVF, regular diet VTE - SCDs, lovenox Foley - none Follow up - 2-3 weeks DOW clinic  Plan - Patient stable for discharge from surgical standpoint. Discharge instructions and f/u information on AVS. I sent a rx for oxycodone to her pharmacy.    LOS: 3 days    Franne Forts , [facility] 05/20/2017, 8:17 AM Pager: [phone] Consults: [phone] Mon-Fri 7:00 am-4:30 pm Sat-Sun 7:00 am-11:30 am

## 2017-05-20 NOTE — Progress Notes (Signed)
Pt tolerated regular diet, pain is controlled. Discharge instructions given to pt. Discharged to home.

## 2017-05-20 NOTE — Discharge Summary (Addendum)
Discharge Summary  Hannah Burgess ZOX:[id number] DOB: October 17, 1955  PCP: Billee Cashing, MD  Admit date: 05/17/2017 Discharge date: 05/20/2017  Time spent: < 25 minutes  Admitted From: Home  Disposition: Home  Recommendations for Outpatient Follow-up:  1. Follow up with General Surgery in 2-3 weeks.    Home Health:NO Equipment/Devices:NO  Discharge Diagnoses:  Active Hospital Problems   Diagnosis Date Noted  . Acute pancreatitis 05/17/2017  . Hypertension 05/17/2017  . Elevated LFTs 05/17/2017  . Cholelithiasis 05/17/2017  . Acute biliary pancreatitis 05/17/2017    Resolved Hospital Problems  No resolved problems to display.    Discharge Condition:Stable  CODE STATUS:FULL Diet recommendation: Heart Healthy   Vitals:   05/20/17 0214 05/20/17 0530  BP: 121/62 135/75  Pulse: 70 69  Resp: 16 16  Temp: 98.2 F (36.8 C) 98 F (36.7 C)  SpO2: 97% 98%    History of present illness:  Hannah Burgess is a 62 y.o. year old female with medical history significant for HTN who presented on 05/17/2017 with acute severe abdominal pain, nausea and non bloody vomiting episode x 1and was found to have Acute biliary pancreatitis. Remaining hospital course addressed below in problem based format:    Hospital Course:  Principal Problem:   Acute pancreatitis Active Problems:   Hypertension   Elevated LFTs   Cholelithiasis   Acute biliary pancreatitis   #Acute Biliary pancreatitis- - presented with acute sever abdominal pain and emesis - Admission labs notable for Lipase 1282, AST226, ALT 143, and normal bilirubin - CT abdo w/ contrast: peripancreatic inflammation c/w acute uncomplicated pancreatitis - RUQ ultrasound: cholelithiasis, "somewhat contracted Gallbladder, without thickening" - Treated supportively with IVF, IV pain control and antiemetics, correction of hypokalemia - Once she became stable, General surgery performed intraoperative cholangiogram (prior to  surgery)- no evidence of choledocholithiasis - Her LFTs returned to close to normal prior to d/c ( AST 62, ALT 64, lipase normalized at 26) - s/p lap cholecystecomy on 4/2, tolerated regular diet, pain well controlled prior to discharge.   - Surgical consultants prescribed oxycdone on discharge.  Arranged follow up in clinic  #Hypertension -remained controlled on home amlodipine - briefly held during NPO status   Antibiotics: Ciprofloxain 4/2(perioperatively)  Microbiology: None  Consultations:  General Surgery   Procedures/Studies: Laparoscopic cholecystectomy with cholangiogram- on 4/2     Dg Cholangiogram Operative  Result Date: 05/19/2017 CLINICAL DATA:  Intraoperative cholangiogram during laparoscopic cholecystectomy. EXAM: INTRAOPERATIVE CHOLANGIOGRAM FLUOROSCOPY TIME:  9 seconds COMPARISON:  Right upper quadrant abdominal ultrasound-05/18/2017; CT abdomen and pelvis - 05/17/2017 FINDINGS: Intraoperative cholangiographic images of the right upper abdominal quadrant during laparoscopic cholecystectomy are provided for review. Surgical clips overlie the expected location of the gallbladder fossa. Contrast injection demonstrates selective cannulation of the central aspect of the cystic duct. There is passage of contrast through the central aspect of the cystic duct with filling of a non dilated common bile duct. There is passage of contrast though the CBD and into the descending portion of the duodenum. There is minimal reflux of injected contrast into the common hepatic duct and central aspect of the non dilated intrahepatic biliary system. There are no discrete filling defects within the opacified portions of the biliary system to suggest the presence of choledocholithiasis. IMPRESSION: No evidence of choledocholithiasis. Electronically Signed   By: Simonne Come M.D.   On: 05/19/2017 14:09   Ct Abdomen Pelvis W Contrast  Result Date: 05/17/2017 CLINICAL DATA:  Epigastric and left  lower quadrant pain with vomiting. Elevated  lipase. EXAM: CT ABDOMEN AND PELVIS WITH CONTRAST TECHNIQUE: Multidetector CT imaging of the abdomen and pelvis was performed using the standard protocol following bolus administration of intravenous contrast. CONTRAST:  ISOVUE-300 IOPAMIDOL (ISOVUE-300) INJECTION 61% COMPARISON:  09/23/2015 FINDINGS: Lower chest: No acute abnormality. Hepatobiliary: Tiny dependent gallstones are noted without secondary signs of cholecystitis. No space-occupying mass of the liver. No biliary dilatation. Pancreas: Peripancreatic inflammatory changes are noted. No evidence of pancreatic necrosis or ductal dilatation. No enhancing mass lesions. Fluid along the right lateral conal fascia is seen. Spleen: Punctate calcifications/granulomas within the normal size spleen. Adrenals/Urinary Tract: Unremarkable bilateral adrenal glands and kidneys apart from what may represent a 5 mm cyst in lower pole the right kidney. No obstructive uropathy. The urinary bladder is unremarkable for degree of distention. Stomach/Bowel: Decompressed stomach. No bowel obstruction or free air. Inflammatory changes in the right upper quadrant are noted adjacent to the second and third portion of the duodenum likely the patient's pancreatic inflammation although the possibility of peptic ulcer disease could have a similar appearance. Scattered colonic diverticulosis without acute diverticulitis more so along the left colon. Vascular/Lymphatic: Aortic atherosclerosis. No aneurysm. No enlarged abdominal or pelvic lymph nodes. Reproductive: Intramural and subserosal uterine fibroids are redemonstrated. Two are noted on the right measuring between 14 and 26 mm and one on the left measuring 38 mm. No adnexal mass. Other: Tiny fat containing umbilical hernia.  No free air. Musculoskeletal: L3 vertebral body hemangioma. Mild disc space narrowing L3-4. IMPRESSION: 1. Peripancreatic inflammation consistent with acute  uncomplicated pancreatitis given the patient's elevated lipase. 2. Uncomplicated cholelithiasis.  No choledocholithiasis. 3. Stable fibroid uterus. Electronically Signed   By: Tollie Eth M.D.   On: 05/17/2017 18:47   US Abdomen Limited Ruq  Result Date: 05/18/2017 CLINICAL DATA:  Cholelithiasis EXAM: ULTRASOUND ABDOMEN LIMITED RIGHT UPPER QUADRANT COMPARISON:  CT abdomen and pelvis May 17, 2017 FINDINGS: Gallbladder: Gallbladder is contracted with multiple shadowing foci consistent with cholelithiasis. Largest gallstone measures 1.8 cm in length. Gallbladder wall does not appear appreciably thickened, and there is no pericholecystic fluid. No sonographic Murphy sign noted by sonographer. Common bile duct: Diameter: 5 mm. No intrahepatic or extrahepatic biliary duct dilatation. Liver: No focal lesion identified. Within normal limits in parenchymal echogenicity. Portal vein is patent on color Doppler imaging with normal direction of blood flow towards the liver. IMPRESSION: Cholelithiasis. Gallbladder somewhat contracted without thickening of the wall. Study otherwise unremarkable. Electronically Signed   By: Bretta Bang III M.D.   On: 05/18/2017 10:48      Discharge Exam: BP 135/75 (BP Location: Left Arm)   Pulse 69   Temp 98 F (36.7 C) (Oral)   Resp 16   Ht 5\' 11"  (1.803 m)   Wt 102 kg (224 lb 13.9 oz)   SpO2 98%   BMI 31.36 kg/m   General: Lying in bed, no apparent distress Eyes: EOMI, anicteric ENT: Oral Mucosa clear and moist Neck: normal, no thyromegaly Cardiovascular: regular rate and rhythm, no murmurs, rubs or gallops, no peripheral edema Respiratory: Normal respiratory effort on room air, lungs clear to auscultation bilaterally Abdomen: laparoscopic incisions in place, c/d/i, soft, non-distended, non-tender, normal bowel sounds Skin: No Rash Musculoskeletal:Good ROM, no contractures. Normal muscle tone Neurologic: Grossly no focal neuro deficit.Mental status AAOx3,  speech normal, Psychiatric:Appropriate affect, and mood   Discharge Instructions You were cared for by a hospitalist during your hospital stay. If you have any questions about your discharge medications or the care you received  while you were in the hospital after you are discharged, you can call the unit and asked to speak with the hospitalist on call if the hospitalist that took care of you is not available. Once you are discharged, your primary care physician will handle any further medical issues. Please note that NO REFILLS for any discharge medications will be authorized once you are discharged, as it is imperative that you return to your primary care physician (or establish a relationship with a primary care physician if you do not have one) for your aftercare needs so that they can reassess your need for medications and monitor your lab values.  Discharge Instructions    Diet - low sodium heart healthy   Complete by:  As directed    Increase activity slowly   Complete by:  As directed      Allergies as of 05/20/2017      Reactions   Penicillins Rash   Has patient had a PCN reaction causing immediate rash, facial/tongue/throat swelling, SOB or lightheadedness with hypotension: Yes Has patient had a PCN reaction causing severe rash involving mucus membranes or skin necrosis: No Has patient had a PCN reaction that required hospitalization No Has patient had a PCN reaction occurring within the last 10 years: No If all of the above answers are "NO", then may proceed with Cephalosporin use.      Medication List    TAKE these medications   amLODipine 10 MG tablet Commonly known as:  NORVASC Take 10 mg by mouth daily.   oxyCODONE 5 MG immediate release tablet Commonly known as:  Oxy IR/ROXICODONE Take 1 tablet (5 mg total) by mouth every 6 (six) hours as needed for severe pain.      Allergies  Allergen Reactions  . Penicillins Rash    Has patient had a PCN reaction causing  immediate rash, facial/tongue/throat swelling, SOB or lightheadedness with hypotension: Yes Has patient had a PCN reaction causing severe rash involving mucus membranes or skin necrosis: No Has patient had a PCN reaction that required hospitalization No Has patient had a PCN reaction occurring within the last 10 years: No If all of the above answers are "NO", then may proceed with Cephalosporin use.    Follow-up Information    [facility] Follow up on 06/09/2017.   Specialty:  General Surgery Why:  Your appointment is at 0900.  Be at the office 30 minutes early for check in.  Bring photo ID and insurance information.   Contact information: [address] [place] [address] [phone]            The results of significant diagnostics from this hospitalization (including imaging, microbiology, ancillary and laboratory) are listed below for reference.    Significant Diagnostic Studies: Dg Cholangiogram Operative  Result Date: 05/19/2017 CLINICAL DATA:  Intraoperative cholangiogram during laparoscopic cholecystectomy. EXAM: INTRAOPERATIVE CHOLANGIOGRAM FLUOROSCOPY TIME:  9 seconds COMPARISON:  Right upper quadrant abdominal ultrasound-05/18/2017; CT abdomen and pelvis - 05/17/2017 FINDINGS: Intraoperative cholangiographic images of the right upper abdominal quadrant during laparoscopic cholecystectomy are provided for review. Surgical clips overlie the expected location of the gallbladder fossa. Contrast injection demonstrates selective cannulation of the central aspect of the cystic duct. There is passage of contrast through the central aspect of the cystic duct with filling of a non dilated common bile duct. There is passage of contrast though the CBD and into the descending portion of the duodenum. There is minimal reflux of injected contrast into the  common hepatic duct and central aspect of the non dilated intrahepatic biliary system. There are no discrete  filling defects within the opacified portions of the biliary system to suggest the presence of choledocholithiasis. IMPRESSION: No evidence of choledocholithiasis. Electronically Signed   By: Simonne Come M.D.   On: 05/19/2017 14:09   Ct Abdomen Pelvis W Contrast  Result Date: 05/17/2017 CLINICAL DATA:  Epigastric and left lower quadrant pain with vomiting. Elevated lipase. EXAM: CT ABDOMEN AND PELVIS WITH CONTRAST TECHNIQUE: Multidetector CT imaging of the abdomen and pelvis was performed using the standard protocol following bolus administration of intravenous contrast. CONTRAST:  ISOVUE-300 IOPAMIDOL (ISOVUE-300) INJECTION 61% COMPARISON:  09/23/2015 FINDINGS: Lower chest: No acute abnormality. Hepatobiliary: Tiny dependent gallstones are noted without secondary signs of cholecystitis. No space-occupying mass of the liver. No biliary dilatation. Pancreas: Peripancreatic inflammatory changes are noted. No evidence of pancreatic necrosis or ductal dilatation. No enhancing mass lesions. Fluid along the right lateral conal fascia is seen. Spleen: Punctate calcifications/granulomas within the normal size spleen. Adrenals/Urinary Tract: Unremarkable bilateral adrenal glands and kidneys apart from what may represent a 5 mm cyst in lower pole the right kidney. No obstructive uropathy. The urinary bladder is unremarkable for degree of distention. Stomach/Bowel: Decompressed stomach. No bowel obstruction or free air. Inflammatory changes in the right upper quadrant are noted adjacent to the second and third portion of the duodenum likely the patient's pancreatic inflammation although the possibility of peptic ulcer disease could have a similar appearance. Scattered colonic diverticulosis without acute diverticulitis more so along the left colon. Vascular/Lymphatic: Aortic atherosclerosis. No aneurysm. No enlarged abdominal or pelvic lymph nodes. Reproductive: Intramural and subserosal uterine fibroids are  redemonstrated. Two are noted on the right measuring between 14 and 26 mm and one on the left measuring 38 mm. No adnexal mass. Other: Tiny fat containing umbilical hernia.  No free air. Musculoskeletal: L3 vertebral body hemangioma. Mild disc space narrowing L3-4. IMPRESSION: 1. Peripancreatic inflammation consistent with acute uncomplicated pancreatitis given the patient's elevated lipase. 2. Uncomplicated cholelithiasis.  No choledocholithiasis. 3. Stable fibroid uterus. Electronically Signed   By: Tollie Eth M.D.   On: 05/17/2017 18:47   US Abdomen Limited Ruq  Result Date: 05/18/2017 CLINICAL DATA:  Cholelithiasis EXAM: ULTRASOUND ABDOMEN LIMITED RIGHT UPPER QUADRANT COMPARISON:  CT abdomen and pelvis May 17, 2017 FINDINGS: Gallbladder: Gallbladder is contracted with multiple shadowing foci consistent with cholelithiasis. Largest gallstone measures 1.8 cm in length. Gallbladder wall does not appear appreciably thickened, and there is no pericholecystic fluid. No sonographic Murphy sign noted by sonographer. Common bile duct: Diameter: 5 mm. No intrahepatic or extrahepatic biliary duct dilatation. Liver: No focal lesion identified. Within normal limits in parenchymal echogenicity. Portal vein is patent on color Doppler imaging with normal direction of blood flow towards the liver. IMPRESSION: Cholelithiasis. Gallbladder somewhat contracted without thickening of the wall. Study otherwise unremarkable. Electronically Signed   By: Bretta Bang III M.D.   On: 05/18/2017 10:48    Microbiology: Recent Results (from the past 240 hour(s))  Surgical pcr screen     Status: None   Collection Time: 05/18/17 10:15 PM  Result Value Ref Range Status   MRSA, PCR NEGATIVE NEGATIVE Final   Staphylococcus aureus NEGATIVE NEGATIVE Final    Comment: (NOTE) The Xpert SA Assay (FDA approved for NASAL specimens in patients 73 years of age and older), is one component of a comprehensive surveillance program. It  is not intended to diagnose infection nor to guide or monitor  treatment. Performed at [facility], [address] [place], [place] [address]      Labs: Basic Metabolic Panel: Recent Labs  Lab 05/17/17 1357 05/18/17 0146 05/19/17 0453 05/20/17 0428  NA 140 140 140 138  K 3.4* 3.3* 3.5 4.0  CL 105 108 106 106  CO2 25 24 24  20*  GLUCOSE 101* 93 95 120*  BUN 9 9 5* 7  CREATININE 0.85 0.82 0.85 0.84  CALCIUM 9.4 8.3* 8.6* 8.8*  MG  --  1.9  --   --    Liver Function Tests: Recent Labs  Lab 05/17/17 1357 05/18/17 0146 05/19/17 0453 05/20/17 0428  AST 226* 78* 42* 62*  ALT 142* 89* 59* 64*  ALKPHOS 168* 144* 146* 138*  BILITOT 1.3* 0.9 1.2 1.0  PROT 7.3 6.0* 6.3* 6.3*  ALBUMIN 3.8 3.2* 3.2* 3.2*   Recent Labs  Lab 05/17/17 1357 05/18/17 0146 05/19/17 0453 05/20/17 0428  LIPASE 1,282* 270* 41 26   No results for input(s): AMMONIA in the last 168 hours. CBC: Recent Labs  Lab 05/17/17 1357 05/18/17 0146 05/20/17 0428  WBC 9.2 8.6 12.2*  NEUTROABS  --  5.8  --   HGB 13.5 11.9* 11.7*  HCT 42.1 36.5 35.9*  MCV 83.9 84.9 83.9  PLT 191 169 177   Cardiac Enzymes: No results for input(s): CKTOTAL, CKMB, CKMBINDEX, TROPONINI in the last 168 hours. BNP: BNP (last 3 results) No results for input(s): BNP in the last 8760 hours.  ProBNP (last 3 results) No results for input(s): PROBNP in the last 8760 hours.  CBG: Recent Labs  Lab 05/18/17 0819 05/19/17 0813  GLUCAP 84 81       Signed:  Laverna PeaceShayla D Armenia Silveria, MD [facility] 05/20/2017, 11:46 AM

## 2018-10-13 ENCOUNTER — Other Ambulatory Visit: Payer: Self-pay | Admitting: Family Medicine

## 2018-10-13 DIAGNOSIS — Z1231 Encounter for screening mammogram for malignant neoplasm of breast: Secondary | ICD-10-CM

## 2018-11-25 ENCOUNTER — Other Ambulatory Visit: Payer: Self-pay

## 2018-11-25 ENCOUNTER — Ambulatory Visit
Admission: RE | Admit: 2018-11-25 | Discharge: 2018-11-25 | Disposition: A | Discharge status: Home / Self Care | Payer: 59 | Source: Ambulatory Visit | Attending: Family Medicine | Admitting: Family Medicine

## 2018-11-25 DIAGNOSIS — Z1231 Encounter for screening mammogram for malignant neoplasm of breast: Secondary | ICD-10-CM

## 2020-12-30 IMAGING — MG MM DIGITAL SCREENING BILAT W/ TOMO W/ CAD
8 series · 8 of 24 positions shown · non-contrast
Comparison: Previous exam(s).

CLINICAL DATA: Screening.

EXAM:
DIGITAL SCREENING BILATERAL MAMMOGRAM WITH TOMO AND CAD

[R CC synth-2D]
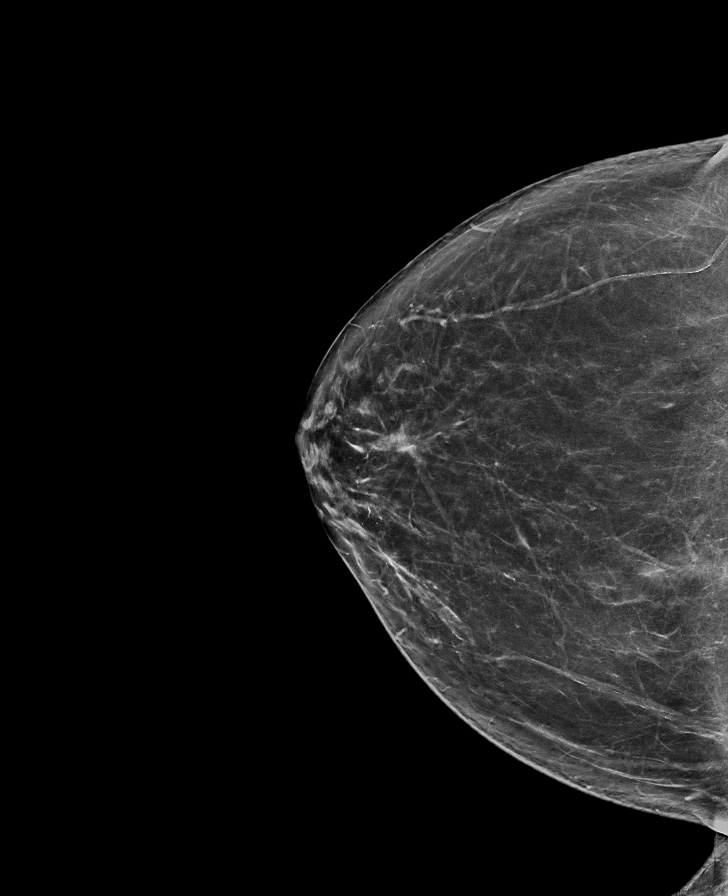

[L MLO synth-2D]
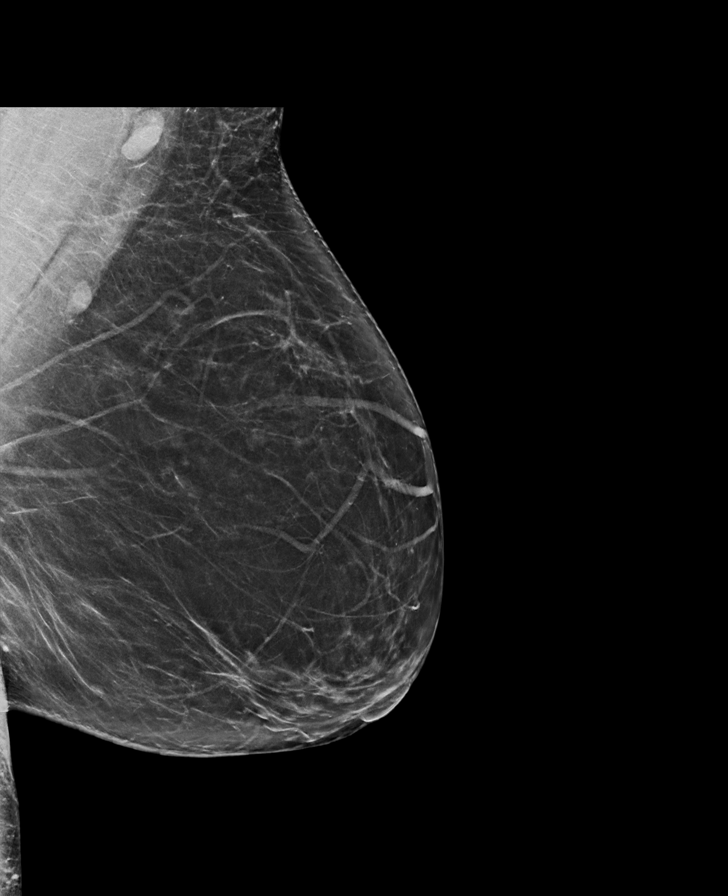

[R MLO synth-2D]
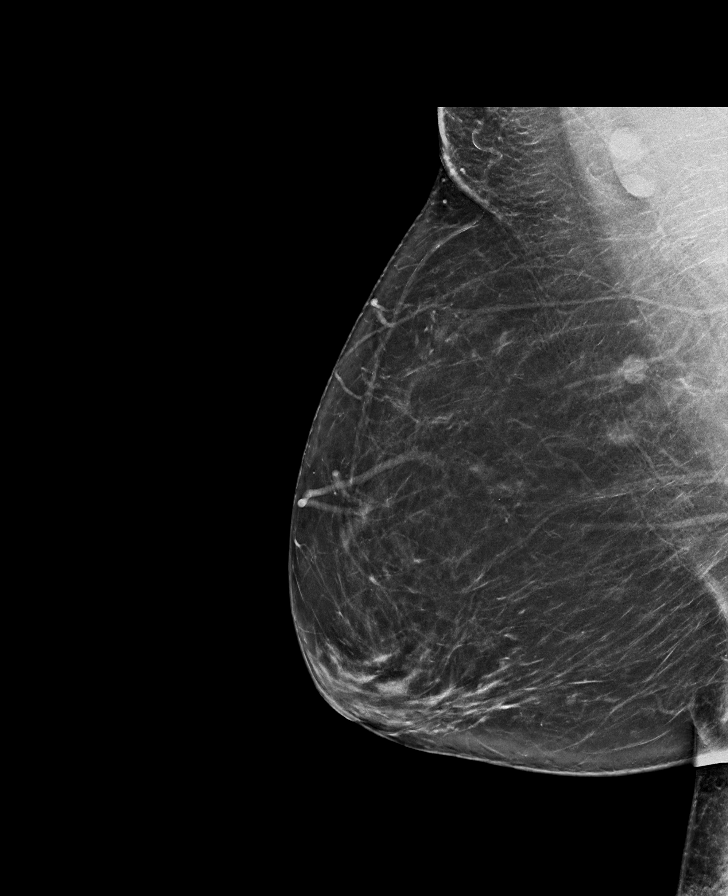

[L CC synth-2D]
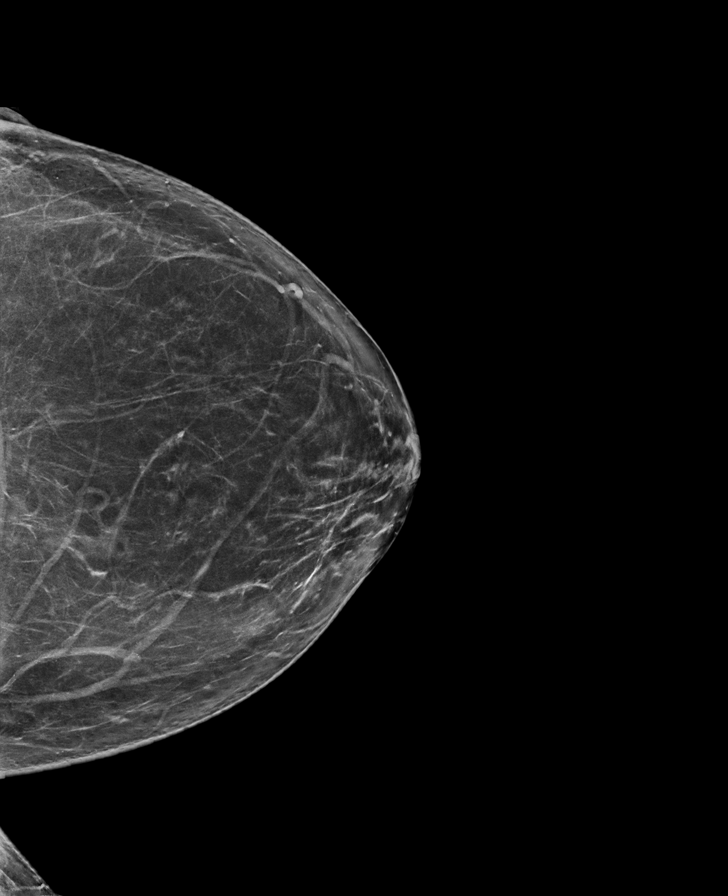

[R CC tomo · tomo slice 37/72.0]
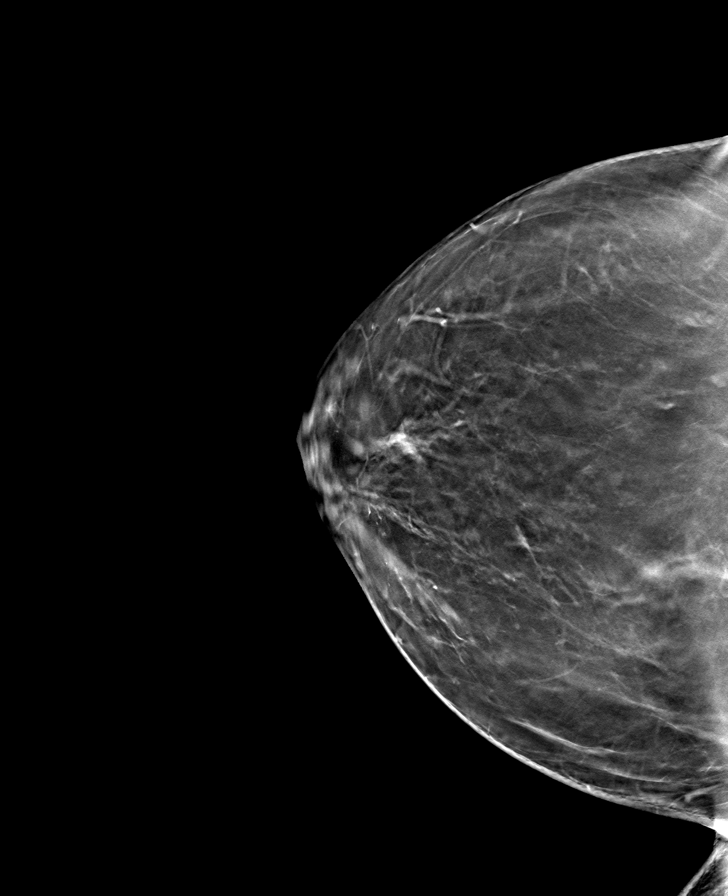

[R MLO tomo · tomo slice 40/79.0]
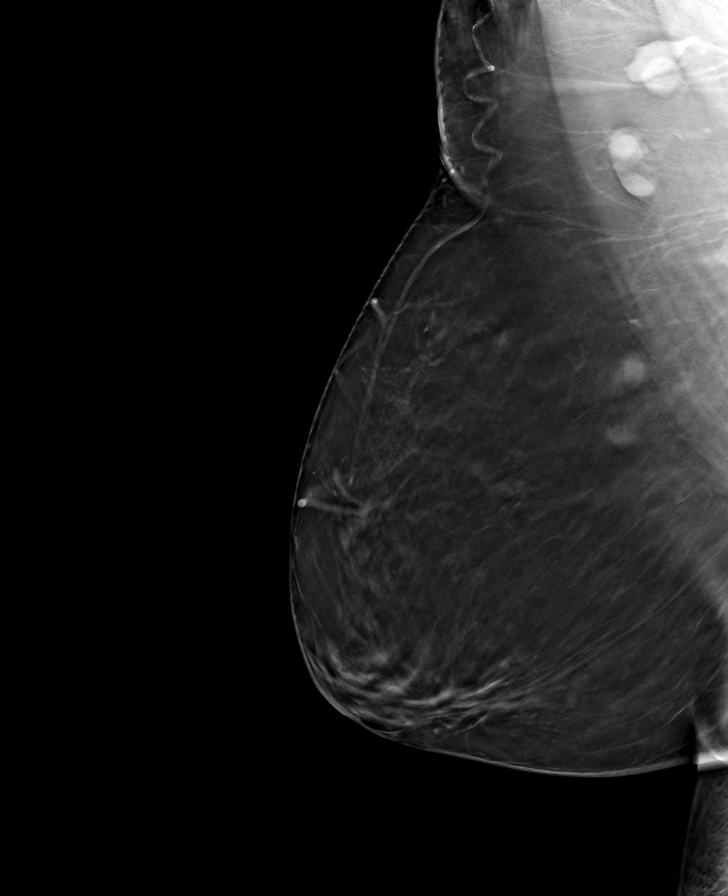

[L MLO tomo · tomo slice 39/76.0]
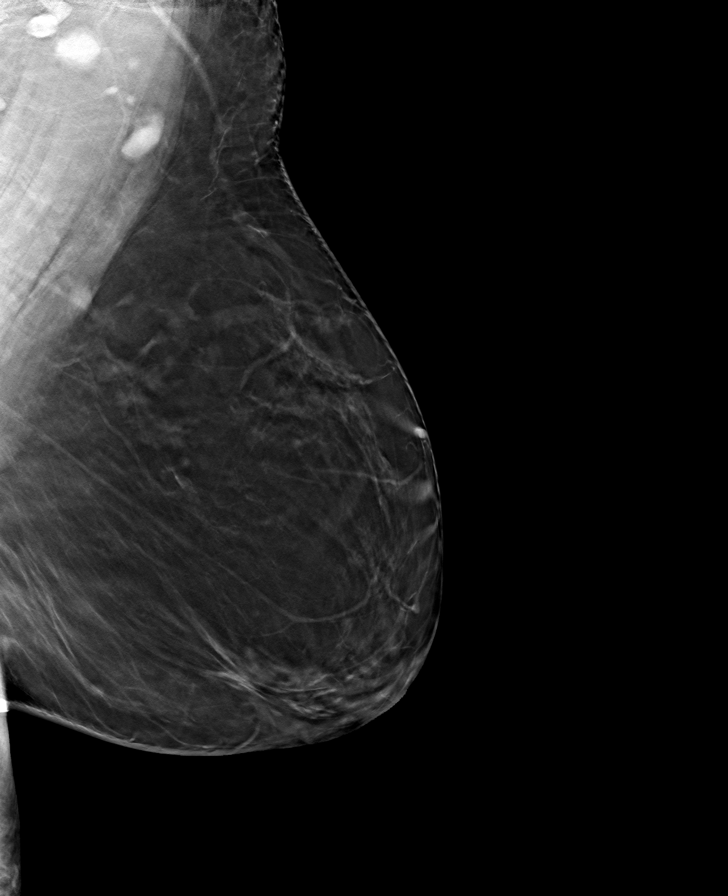

[L CC tomo · tomo slice 37/74.0]
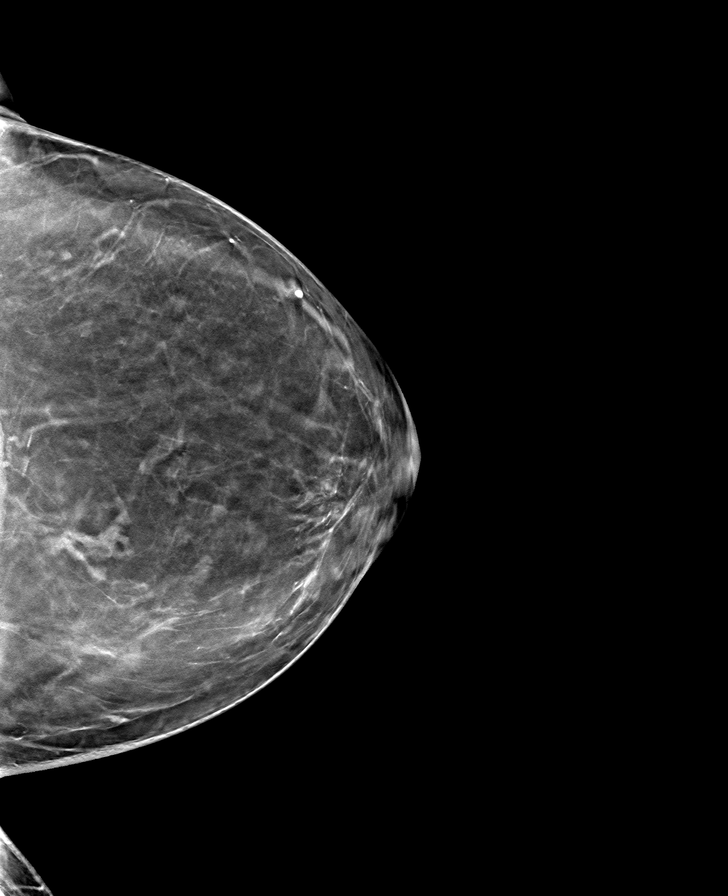

[8 of 24 positions shown; findings below may reference images not displayed]

ACR Breast Density Category b: There are scattered areas of
fibroglandular density.
FINDINGS: There are no findings suspicious for malignancy. Images were
processed with CAD.
IMPRESSION: No mammographic evidence of malignancy. A result letter of this
screening mammogram will be mailed directly to the patient.

RECOMMENDATION:
Screening mammogram in one year. (Code:[id number])

BI-RADS CATEGORY  1: Negative.

## 2021-02-28 ENCOUNTER — Other Ambulatory Visit: Payer: Self-pay

## 2021-02-28 ENCOUNTER — Encounter: Payer: Self-pay | Admitting: Podiatry

## 2021-02-28 ENCOUNTER — Ambulatory Visit: Payer: Medicare HMO | Admitting: Podiatry

## 2021-02-28 DIAGNOSIS — B351 Tinea unguium: Secondary | ICD-10-CM | POA: Diagnosis not present

## 2021-02-28 DIAGNOSIS — L600 Ingrowing nail: Secondary | ICD-10-CM | POA: Diagnosis not present

## 2021-02-28 NOTE — Patient Instructions (Signed)

## 2021-03-01 NOTE — Progress Notes (Signed)
Subjective:   Patient ID: Hannah Burgess, female   DOB: 66 y.o.   MRN: [id number]   HPI Patient presents with 2 different problems with 1 being an ingrown toenail of the left big toe and secondarily there being a problem with just thickness of the nailbeds and inability to cut comfortably.  Patient does not smoke and tries to be active   Review of Systems  All other systems reviewed and are negative.      Objective:  Physical Exam Vitals and nursing note reviewed.  Constitutional:      Appearance: She is well-developed.  Pulmonary:     Effort: Pulmonary effort is normal.  Musculoskeletal:        General: Normal range of motion.  Skin:    General: Skin is warm.  Neurological:     Mental Status: She is alert.    Neurovascular status was found to be intact muscle strength was found to be adequate range of motion adequate.  Patient does have nail disease 1 through 5 of both feet with thickness of the beds and has a lot of discomfort lateral border left hallux with a significant incurvation of the nail bed and damaged overall structural hallux nail left foot.  Good digital perfusion well oriented x3     Assessment:  Chronic ingrown toenail with damage left hallux along with nail beds bilateral that are thick and painful      Plan:  H&P reviewed condition and I do recommend we remove the nail border left and I explained ultimately I may have to remove the entire nail.  Patient understands when to try to work on the border I went ahead and I infiltrated 60 mg like Marcaine mixture after allowing her to read the consent form going over procedure risk and then under sterile conditions I remove the lateral border.  There was a small amount of distal pus formation which is part of the pain and I cleaned all this out and it did not affect the proximal area where I want to put the chemical.  I went ahead today apply chemical 3 applications 30 seconds followed by alcohol lavage I applied sterile  dressing leave this on for 24 hours but take it off earlier if needed and I explained soaks and the importance of this and if any pathology or problems were to occur she is to let us know immediately.  I debrided remaining nails do not recommend further treatment even though there may be some nails will have to be removed long-term
